# Patient Record
Sex: Male | Born: 1960
Health system: Southern US, Community
[De-identification: ages and names within clinical notes are randomized; demographics above are authoritative.]

## PROBLEM LIST (undated history)

## (undated) DIAGNOSIS — G4730 Sleep apnea, unspecified: Secondary | ICD-10-CM

## (undated) DIAGNOSIS — I10 Essential (primary) hypertension: Secondary | ICD-10-CM

## (undated) DIAGNOSIS — S40212A Abrasion of left shoulder, initial encounter: Secondary | ICD-10-CM

## (undated) DIAGNOSIS — M1990 Unspecified osteoarthritis, unspecified site: Secondary | ICD-10-CM

## (undated) DIAGNOSIS — M25512 Pain in left shoulder: Secondary | ICD-10-CM

## (undated) HISTORY — DX: Essential (primary) hypertension: I10

## (undated) HISTORY — PX: COLONOSCOPY: SHX174

## (undated) HISTORY — PX: HERNIA REPAIR: SHX51

## (undated) HISTORY — PX: NASAL SEPTUM SURGERY: SHX37

---

## 1999-01-04 ENCOUNTER — Emergency Department (HOSPITAL_COMMUNITY): Admission: EM | Admit: 1999-01-04 | Discharge: 1999-01-04 | Payer: Self-pay | Admitting: Emergency Medicine

## 2009-05-12 ENCOUNTER — Emergency Department (HOSPITAL_COMMUNITY): Admission: EM | Admit: 2009-05-12 | Discharge: 2009-05-13 | Payer: Self-pay | Admitting: Emergency Medicine

## 2010-11-28 LAB — CBC
HCT: 41 % (ref 39.0–52.0)
Hemoglobin: 14.2 g/dL (ref 13.0–17.0)
RBC: 4.46 MIL/uL (ref 4.22–5.81)

## 2010-11-28 LAB — ETHANOL: Alcohol, Ethyl (B): 275 mg/dL — ABNORMAL HIGH (ref 0–10)

## 2010-11-28 LAB — PROTIME-INR
INR: 0.9 (ref 0.00–1.49)
Prothrombin Time: 12.4 seconds (ref 11.6–15.2)

## 2011-10-27 ENCOUNTER — Other Ambulatory Visit: Payer: Self-pay | Admitting: Family Medicine

## 2011-11-10 ENCOUNTER — Ambulatory Visit (INDEPENDENT_AMBULATORY_CARE_PROVIDER_SITE_OTHER): Payer: BC Managed Care – PPO | Admitting: Internal Medicine

## 2011-11-10 VITALS — BP 144/94 | HR 76 | Temp 97.6°F | Resp 16 | Ht 69.25 in | Wt 203.0 lb

## 2011-11-10 DIAGNOSIS — I10 Essential (primary) hypertension: Secondary | ICD-10-CM | POA: Insufficient documentation

## 2011-11-10 DIAGNOSIS — Z0000 Encounter for general adult medical examination without abnormal findings: Secondary | ICD-10-CM

## 2011-11-10 LAB — POCT CBC
HCT, POC: 40 % — AB (ref 43.5–53.7)
Lymph, poc: 1.7 (ref 0.6–3.4)
MCH, POC: 30.2 pg (ref 27–31.2)
MCHC: 33.5 g/dL (ref 31.8–35.4)
MCV: 90.2 fL (ref 80–97)
MID (cbc): 0.4 (ref 0–0.9)
POC LYMPH PERCENT: 34 %L (ref 10–50)
Platelet Count, POC: 288 10*3/uL (ref 142–424)
RDW, POC: 12.8 %
WBC: 4.9 10*3/uL (ref 4.6–10.2)

## 2011-11-10 LAB — COMPREHENSIVE METABOLIC PANEL
AST: 31 U/L (ref 0–37)
BUN: 12 mg/dL (ref 6–23)
CO2: 25 mEq/L (ref 19–32)
Calcium: 9.7 mg/dL (ref 8.4–10.5)
Chloride: 103 mEq/L (ref 96–112)
Creat: 0.74 mg/dL (ref 0.50–1.35)
Total Bilirubin: 0.3 mg/dL (ref 0.3–1.2)

## 2011-11-10 LAB — POCT URINALYSIS DIPSTICK
Blood, UA: NEGATIVE
Ketones, UA: NEGATIVE
Protein, UA: NEGATIVE
Spec Grav, UA: 1.02
pH, UA: 6

## 2011-11-10 LAB — POCT UA - MICROSCOPIC ONLY
Crystals, Ur, HPF, POC: NEGATIVE
Mucus, UA: NEGATIVE
Yeast, UA: NEGATIVE

## 2011-11-10 LAB — LIPID PANEL
Cholesterol: 201 mg/dL — ABNORMAL HIGH (ref 0–200)
HDL: 28 mg/dL — ABNORMAL LOW (ref >39)
Total CHOL/HDL Ratio: 7.2 Ratio

## 2011-11-10 MED ORDER — AMLODIPINE BESYLATE 10 MG PO TABS: 10.0000 mg | ORAL_TABLET | Freq: Every day | ORAL | Status: DC

## 2011-11-10 NOTE — Progress Notes (Signed)
  Subjective:    Patient ID: Terry Guerra, male    DOB: [birth year], 51 y.o.   MRN: [id number]  HPI HTN out of med. On amlodipine Feels great. See scanned hx    Review of Systems See scanned ROS   Objective:   Physical Exam  Constitutional: He is oriented to person, place, and time. He appears well-developed and well-nourished. No distress.  HENT:  Head: Normocephalic.  Right Ear: External ear normal.  Left Ear: External ear normal.  Nose: Nose normal.  Mouth/Throat: No oropharyngeal exudate.  Eyes: EOM are normal. Pupils are equal, round, and reactive to light. No scleral icterus.  Neck: Normal range of motion. Neck supple. No thyromegaly present.  Cardiovascular: Normal rate, regular rhythm and normal heart sounds.   Pulmonary/Chest: Effort normal and breath sounds normal.  Abdominal: Soft. Bowel sounds are normal. He exhibits no mass. There is no tenderness.  Genitourinary: Rectum normal, prostate normal and penis normal.  Musculoskeletal: Normal range of motion.  Lymphadenopathy:    He has no cervical adenopathy.  Neurological: He is alert and oriented to person, place, and time. He has normal reflexes. No cranial nerve deficit.  Skin: Skin is warm and dry.  Psychiatric: He has a normal mood and affect.          Assessment & Plan:  HTN restart amlodipine 10mg  Exercise RTC 6 mos

## 2011-11-10 NOTE — Patient Instructions (Signed)

## 2012-11-28 ENCOUNTER — Telehealth: Payer: Self-pay

## 2012-11-28 NOTE — Telephone Encounter (Signed)
Patient would like to know when his last stress test was please call patient at [phone]

## 2012-11-28 NOTE — Telephone Encounter (Signed)
Will pull chart and review.

## 2012-11-29 NOTE — Telephone Encounter (Signed)
Unclear, do not have report, but was referred for stress testing by Dr Cleta Alberts in 2008 to [facility], provided their number [phone]

## 2012-12-08 ENCOUNTER — Other Ambulatory Visit: Payer: Self-pay | Admitting: Internal Medicine

## 2013-01-12 ENCOUNTER — Other Ambulatory Visit: Payer: Self-pay | Admitting: Physician Assistant

## 2013-01-14 ENCOUNTER — Telehealth: Payer: Self-pay

## 2013-01-14 NOTE — Telephone Encounter (Signed)
Patient calling to ask if he can get a refill on:  amLODipine (NORVASC) 10 MG tablet  Refills: 0 ordered Pharmacy: [id number] - [place], [place] - [address]  If possible to get refilled or if needs an office visit please call:   [phone]

## 2013-01-14 NOTE — Telephone Encounter (Signed)
This was refilled on 01/12/13. Needs office visit for further refills.

## 2013-01-15 ENCOUNTER — Other Ambulatory Visit: Payer: Self-pay

## 2013-01-15 MED ORDER — AMLODIPINE BESYLATE 10 MG PO TABS: 10.0000 mg | ORAL_TABLET | Freq: Every day | ORAL | Status: DC

## 2013-01-15 NOTE — Telephone Encounter (Signed)
Called pt, [facility] Rx sent in to [facility] on [address].

## 2013-01-17 ENCOUNTER — Other Ambulatory Visit: Payer: Self-pay | Admitting: *Deleted

## 2013-02-21 ENCOUNTER — Ambulatory Visit (INDEPENDENT_AMBULATORY_CARE_PROVIDER_SITE_OTHER): Payer: BC Managed Care – PPO | Admitting: Internal Medicine

## 2013-02-21 VITALS — BP 132/79 | HR 83 | Temp 98.0°F | Resp 17 | Ht 68.0 in | Wt 200.0 lb

## 2013-02-21 DIAGNOSIS — Z87898 Personal history of other specified conditions: Secondary | ICD-10-CM

## 2013-02-21 DIAGNOSIS — Z0000 Encounter for general adult medical examination without abnormal findings: Secondary | ICD-10-CM

## 2013-02-21 DIAGNOSIS — R5381 Other malaise: Secondary | ICD-10-CM

## 2013-02-21 DIAGNOSIS — Z833 Family history of diabetes mellitus: Secondary | ICD-10-CM

## 2013-02-21 DIAGNOSIS — R5383 Other fatigue: Secondary | ICD-10-CM

## 2013-02-21 DIAGNOSIS — Z7189 Other specified counseling: Secondary | ICD-10-CM

## 2013-02-21 DIAGNOSIS — Z860100 Personal history of colon polyps, unspecified: Secondary | ICD-10-CM

## 2013-02-21 DIAGNOSIS — I10 Essential (primary) hypertension: Secondary | ICD-10-CM

## 2013-02-21 LAB — POCT URINALYSIS DIPSTICK
Bilirubin, UA: NEGATIVE
Glucose, UA: NEGATIVE
Leukocytes, UA: NEGATIVE
Nitrite, UA: NEGATIVE
pH, UA: 6

## 2013-02-21 LAB — POCT UA - MICROSCOPIC ONLY: Yeast, UA: NEGATIVE

## 2013-02-21 LAB — LIPID PANEL
LDL Cholesterol: 98 mg/dL (ref 0–99)
Triglycerides: 331 mg/dL — ABNORMAL HIGH (ref <150)
VLDL: 66 mg/dL — ABNORMAL HIGH (ref 0–40)

## 2013-02-21 LAB — COMPREHENSIVE METABOLIC PANEL
ALT: 54 U/L — ABNORMAL HIGH (ref 0–53)
AST: 36 U/L (ref 0–37)
Calcium: 10.2 mg/dL (ref 8.4–10.5)
Chloride: 103 mEq/L (ref 96–112)
Creat: 0.86 mg/dL (ref 0.50–1.35)
Potassium: 4 mEq/L (ref 3.5–5.3)

## 2013-02-21 LAB — VITAMIN B12: Vitamin B-12: 654 pg/mL (ref 211–911)

## 2013-02-21 LAB — POCT CBC
Granulocyte percent: 64 %G (ref 37–80)
MID (cbc): 0.4 (ref 0–0.9)
MPV: 8 fL (ref 0–99.8)
POC Granulocyte: 4.1 (ref 2–6.9)
POC LYMPH PERCENT: 29.4 %L (ref 10–50)
Platelet Count, POC: 294 10*3/uL (ref 142–424)
RDW, POC: 13.2 %

## 2013-02-21 LAB — IFOBT (OCCULT BLOOD): IFOBT: POSITIVE

## 2013-02-21 MED ORDER — TADALAFIL 20 MG PO TABS: 20.0000 mg | ORAL_TABLET | Freq: Every day | ORAL | Status: DC | PRN

## 2013-02-21 MED ORDER — AMLODIPINE BESYLATE 10 MG PO TABS: 10.0000 mg | ORAL_TABLET | Freq: Every day | ORAL | Status: DC

## 2013-02-21 NOTE — Progress Notes (Signed)
Subjective:    Patient ID: Terry Guerra, male    DOB: 1960/08/30, 52 y.o.   MRN: [id number]  HPI HTN controlled, has a lot of stress. PHx of colon polps, rising psa, and Fhx of DM Quit smokes over 10 yr.  Review of Systems  Constitutional: Positive for fatigue.  HENT: Negative.   Eyes: Negative.   Respiratory: Negative.   Cardiovascular: Negative.   Gastrointestinal: Negative.   Endocrine: Negative.   Genitourinary: Negative.   Musculoskeletal: Negative.   Skin: Negative.   Allergic/Immunologic: Negative.   Neurological: Negative.   Hematological: Negative.   Psychiatric/Behavioral: Negative.        Objective:   Physical Exam  Constitutional: He is oriented to person, place, and time. He appears well-developed and well-nourished.  HENT:  Right Ear: External ear normal.  Left Ear: External ear normal.  Nose: Nose normal.  Mouth/Throat: Oropharynx is clear and moist.  Eyes: Conjunctivae and EOM are normal. Pupils are equal, round, and reactive to light. No scleral icterus.  Neck: Normal range of motion. Neck supple. No tracheal deviation present. No thyromegaly present.  Cardiovascular: Normal rate, regular rhythm, normal heart sounds and intact distal pulses.   No murmur heard. Pulmonary/Chest: Effort normal and breath sounds normal. No respiratory distress. He has no wheezes. He exhibits no tenderness.  Abdominal: Soft. Bowel sounds are normal. He exhibits no mass. There is no tenderness. There is no rebound.  Genitourinary: Rectum normal, prostate normal and penis normal.  Musculoskeletal: Normal range of motion.  Lymphadenopathy:    He has no cervical adenopathy.  Neurological: He is alert and oriented to person, place, and time. He has normal reflexes. No cranial nerve deficit. He exhibits normal muscle tone. Coordination normal.  Skin: Skin is warm. No rash noted.  Psychiatric: He has a normal mood and affect. His behavior is normal. Judgment and thought content  normal.   EKG nl Results for orders placed in visit on 02/21/13  POCT CBC      Result Value Range   WBC 6.4  4.6 - 10.2 K/uL   Lymph, poc 1.9  0.6 - 3.4   POC LYMPH PERCENT 29.4  10 - 50 %L   MID (cbc) 0.4  0 - 0.9   POC MID % 6.6  0 - 12 %M   POC Granulocyte 4.1  2 - 6.9   Granulocyte percent 64.0  37 - 80 %G   RBC 4.93  4.69 - 6.13 M/uL   Hemoglobin 15.3  14.1 - 18.1 g/dL   HCT, POC 78.2  95.6 - 53.7 %   MCV 94.6  80 - 97 fL   MCH, POC 31.0  27 - 31.2 pg   MCHC 32.8  31.8 - 35.4 g/dL   RDW, POC 21.3     Platelet Count, POC 294  142 - 424 K/uL   MPV 8.0  0 - 99.8 fL  IFOBT (OCCULT BLOOD)      Result Value Range   IFOBT Positive    POCT UA - MICROSCOPIC ONLY      Result Value Range   WBC, Ur, HPF, POC 0-1     RBC, urine, microscopic 0-2     Bacteria, U Microscopic trace     Mucus, UA neg     Epithelial cells, urine per micros 1-3     Crystals, Ur, HPF, POC neg     Casts, Ur, LPF, POC neg     Yeast, UA neg    POCT URINALYSIS  DIPSTICK      Result Value Range   Color, UA yellow     Clarity, UA clear     Glucose, UA neg     Bilirubin, UA neg     Ketones, UA neg     Spec Grav, UA 1.025     Blood, UA neg     pH, UA 6.0     Protein, UA neg     Urobilinogen, UA 0.2     Nitrite, UA neg     Leukocytes, UA Negative    GLUCOSE, POCT (MANUAL RESULT ENTRY)      Result Value Range   POC Glucose 76  70 - 99 mg/dl  POCT GLYCOSYLATED HEMOGLOBIN (HGB A1C)      Result Value Range   Hemoglobin A1C 5.0           Assessment & Plan:  Weight Gain HTN--rf meds 35yr Blood in stool/Colon polps--see GI and agrees Call Dr. Leone Payor for f/up colonoscopy

## 2013-02-21 NOTE — Patient Instructions (Addendum)
DASH Diet The DASH diet stands for "Dietary Approaches to Stop Hypertension." It is a healthy eating plan that has been shown to reduce high blood pressure (hypertension) in as little as 14 days, while also possibly providing other significant health benefits. These other health benefits include reducing the risk of breast cancer after menopause and reducing the risk of type 2 diabetes, heart disease, colon cancer, and stroke. Health benefits also include weight loss and slowing kidney failure in patients with chronic kidney disease.  DIET GUIDELINES  Limit salt (sodium). Your diet should contain less than 1500 mg of sodium daily.  Limit refined or processed carbohydrates. Your diet should include mostly whole grains. Desserts and added sugars should be used sparingly.  Include small amounts of heart-healthy fats. These types of fats include nuts, oils, and tub margarine. Limit saturated and trans fats. These fats have been shown to be harmful in the body. CHOOSING FOODS  The following food groups are based on a 2000 calorie diet. See your Registered Dietitian for individual calorie needs. Grains and Grain Products (6 to 8 servings daily)  Eat More Often: Whole-wheat bread, brown rice, whole-grain or wheat pasta, quinoa, popcorn without added fat or salt (air popped).  Eat Less Often: White bread, white pasta, white rice, cornbread. Vegetables (4 to 5 servings daily)  Eat More Often: Fresh, frozen, and canned vegetables. Vegetables may be raw, steamed, roasted, or grilled with a minimal amount of fat.  Eat Less Often/Avoid: Creamed or fried vegetables. Vegetables in a cheese sauce. Fruit (4 to 5 servings daily)  Eat More Often: All fresh, canned (in natural juice), or frozen fruits. Dried fruits without added sugar. One hundred percent fruit juice ( cup [237 mL] daily).  Eat Less Often: Dried fruits with added sugar. Canned fruit in light or heavy syrup. Foot LockerLean Meats, Fish, and Poultry (2  servings or less daily. One serving is 3 to 4 oz [85-114 g]).  Eat More Often: Ninety percent or leaner ground beef, tenderloin, sirloin. Round cuts of beef, chicken breast, [place]key breast. All fish. Grill, bake, or broil your meat. Nothing should be fried.  Eat Less Often/Avoid: Fatty cuts of meat, [place]key, or chicken leg, thigh, or wing. Fried cuts of meat or fish. Dairy (2 to 3 servings)  Eat More Often: Low-fat or fat-free milk, low-fat plain or light yogurt, reduced-fat or part-skim cheese.  Eat Less Often/Avoid: Milk (whole, 2%).Whole milk yogurt. Full-fat cheeses. Nuts, Seeds, and Legumes (4 to 5 servings per week)  Eat More Often: All without added salt.  Eat Less Often/Avoid: Salted nuts and seeds, canned beans with added salt. Fats and Sweets (limited)  Eat More Often: Vegetable oils, tub margarines without trans fats, sugar-free gelatin. Mayonnaise and salad dressings.  Eat Less Often/Avoid: Coconut oils, palm oils, butter, stick margarine, cream, half and half, cookies, candy, pie. FOR MORE INFORMATION The Dash Diet Eating Plan: www.dashdiet.org Document Released: 07/30/2011 Document Revised: 11/02/2011 Document Reviewed: 07/30/2011 [facility] Patient Information 2014 [place]. Insomnia Insomnia is frequent trouble falling and/or staying asleep. Insomnia can be a long term problem or a short term problem. Both are common. Insomnia can be a short term problem when the wakefulness is related to a certain stress or worry. Long term insomnia is often related to ongoing stress during waking hours and/or poor sleeping habits. Overtime, sleep deprivation itself can make the problem worse. Every little thing feels more severe because you are overtired and your ability to cope is decreased. CAUSES   Stress,  anxiety, and depression.  Poor sleeping habits.  Distractions such as TV in the bedroom.  Naps close to bedtime.  Engaging in emotionally charged conversations before  bed.  Technical reading before sleep.  Alcohol and other sedatives. They may make the problem worse. They can hurt normal sleep patterns and normal dream activity.  Stimulants such as caffeine for several hours prior to bedtime.  Pain syndromes and shortness of breath can cause insomnia.  Exercise late at night.  Changing time zones may cause sleeping problems (jet lag). It is sometimes helpful to have someone observe your sleeping patterns. They should look for periods of not breathing during the night (sleep apnea). They should also look to see how long those periods last. If you live alone or observers are uncertain, you can also be observed at a sleep clinic where your sleep patterns will be professionally monitored. Sleep apnea requires a checkup and treatment. Give your caregivers your medical history. Give your caregivers observations your family has made about your sleep.  SYMPTOMS   Not feeling rested in the morning.  Anxiety and restlessness at bedtime.  Difficulty falling and staying asleep. TREATMENT   Your caregiver may prescribe treatment for an underlying medical disorders. Your caregiver can give advice or help if you are using alcohol or other drugs for self-medication. Treatment of underlying problems will usually eliminate insomnia problems.  Medications can be prescribed for short time use. They are generally not recommended for lengthy use.  Over-the-counter sleep medicines are not recommended for lengthy use. They can be habit forming.  You can promote easier sleeping by making lifestyle changes such as:  Using relaxation techniques that help with breathing and reduce muscle tension.  Exercising earlier in the day.  Changing your diet and the time of your last meal. No night time snacks.  Establish a regular time to go to bed.  Counseling can help with stressful problems and worry.  Soothing music and white noise may be helpful if there are background  noises you cannot remove.  Stop tedious detailed work at least one hour before bedtime. HOME CARE INSTRUCTIONS   Keep a diary. Inform your caregiver about your progress. This includes any medication side effects. See your caregiver regularly. Take note of:  Times when you are asleep.  Times when you are awake during the night.  The quality of your sleep.  How you feel the next day. This information will help your caregiver care for you.  Get out of bed if you are still awake after 15 minutes. Read or do some quiet activity. Keep the lights down. Wait until you feel sleepy and go back to bed.  Keep regular sleeping and waking hours. Avoid naps.  Exercise regularly.  Avoid distractions at bedtime. Distractions include watching television or engaging in any intense or detailed activity like attempting to balance the household checkbook.  Develop a bedtime ritual. Keep a familiar routine of bathing, brushing your teeth, climbing into bed at the same time each night, listening to soothing music. Routines increase the success of falling to sleep faster.  Use relaxation techniques. This can be using breathing and muscle tension release routines. It can also include visualizing peaceful scenes. You can also help control troubling or intruding thoughts by keeping your mind occupied with boring or repetitive thoughts like the old concept of counting sheep. You can make it more creative like imagining planting one beautiful flower after another in your backyard garden.  During your day, work to  eliminate stress. When this is not possible use some of the previous suggestions to help reduce the anxiety that accompanies stressful situations. MAKE SURE YOU:   Understand these instructions.  Will watch your condition.  Will get help right away if you are not doing well or get worse. Document Released: 08/07/2000 Document Revised: 11/02/2011 Document Reviewed: 09/07/2007 [facility] Patient  Information 2014 [place], [place]. Stress Stress-related medical problems are becoming increasingly common. The body has a built-in physical response to stressful situations. Faced with pressure, challenge or danger, we need to react quickly. Our bodies release hormones such as cortisol and adrenaline to help do this. These hormones are part of the "fight or flight" response and affect the metabolic rate, heart rate and blood pressure, resulting in a heightened, stressed state that prepares the body for optimum performance in dealing with a stressful situation. It is likely that early man required these mechanisms to stay alive, but usually modern stresses do not call for this, and the same hormones released in today's world can damage health and reduce coping ability. CAUSES  Pressure to perform at work, at school or in sports.  Threats of physical violence.  Money worries.  Arguments.  Family conflicts.  Divorce or separation from significant other.  Bereavement.  New job or unemployment.  Changes in location.  Alcohol or drug abuse. SOMETIMES, THERE IS NO PARTICULAR REASON FOR DEVELOPING STRESS. Almost all people are at risk of being stressed at some time in their lives. It is important to know that some stress is temporary and some is long term.  Temporary stress will go away when a situation is resolved. Most people can cope with short periods of stress, and it can often be relieved by relaxing, taking a walk, chatting through issues with friends, or having a good night's sleep.  Chronic (long-term, continuous) stress is much harder to deal with. It can be psychologically and emotionally damaging. It can be harmful both for an individual and for friends and family. SYMPTOMS Everyone reacts to stress differently. There are some common effects that help [place] recognize it. In times of extreme stress, people may:  Shake uncontrollably.  Breathe faster and deeper than normal  (hyperventilate).  Vomit.  For people with asthma, stress can trigger an attack.  For some people, stress may trigger migraine headaches, ulcers, and body pain. PHYSICAL EFFECTS OF STRESS MAY INCLUDE:  Loss of energy.  Skin problems.  Aches and pains resulting from tense muscles, including neck ache, backache and tension headaches.  Increased pain from arthritis and other conditions.  Irregular heart beat (palpitations).  Periods of irritability or anger.  Apathy or depression.  Anxiety (feeling uptight or worrying).  Unusual behavior.  Loss of appetite.  Comfort eating.  Lack of concentration.  Loss of, or decreased, sex-drive.  Increased smoking, drinking, or recreational drug use.  For women, missed periods.  Ulcers, joint pain, and muscle pain. Post-traumatic stress is the stress caused by any serious accident, strong emotional damage, or extremely difficult or violent experience such as rape or war. Post-traumatic stress victims can experience mixtures of emotions such as fear, shame, depression, guilt or anger. It may include recurrent memories or images that may be haunting. These feelings can last for weeks, months or even years after the traumatic event that triggered them. Specialized treatment, possibly with medicines and psychological therapies, is available. If stress is causing physical symptoms, severe distress or making it difficult for you to function as normal, it is worth seeing  your caregiver. It is important to remember that although stress is a usual part of life, extreme or prolonged stress can lead to other illnesses that will need treatment. It is better to visit a doctor sooner rather than later. Stress has been linked to the development of high blood pressure and heart disease, as well as insomnia and depression. There is no diagnostic test for stress since everyone reacts to it differently. But a caregiver will be able to spot the physical  symptoms, such as:  Headaches.  Shingles.  Ulcers. Emotional distress such as intense worry, low mood or irritability should be detected when the doctor asks pertinent questions to identify any underlying problems that might be the cause. In case there are physical reasons for the symptoms, the doctor may also want to do some tests to exclude certain conditions. If you feel that you are suffering from stress, try to identify the aspects of your life that are causing it. Sometimes you may not be able to change or avoid them, but even a small change can have a positive ripple effect. A simple lifestyle change can make all the difference. STRATEGIES THAT CAN HELP DEAL WITH STRESS:  Delegating or sharing responsibilities.  Avoiding confrontations.  Learning to be more assertive.  Regular exercise.  Avoid using alcohol or street drugs to cope.  Eating a healthy, balanced diet, rich in fruit and vegetables and proteins.  Finding humor or absurdity in stressful situations.  Never taking on more than you know you can handle comfortably.  Organizing your time better to get as much done as possible.  Talking to friends or family and sharing your thoughts and fears.  Listening to music or relaxation tapes.  Tensing and then relaxing your muscles, starting at the toes and working up to the head and neck. If you think that you would benefit from help, either in identifying the things that are causing your stress or in learning techniques to help you relax, see a caregiver who is capable of helping you with this. Rather than relying on medications, it is usually better to try and identify the things in your life that are causing stress and try to deal with them. There are many techniques of managing stress including counseling, psychotherapy, aromatherapy, yoga, and exercise. Your caregiver can help you determine what is best for you. Document Released: 10/31/2002 Document Revised: 11/02/2011  Document Reviewed: 09/27/2007 [facility] Patient Information 2014 [place], [place]. Colorectal Cancer Colorectal cancer is an abnormal growth of tissue (tumor) in the colon or rectum that is cancerous (malignant). Unlike noncancerous (benign) tumors, malignant tumors can spread to other parts of your body. The colon is the large bowel or large intestine. The rectum is the last several inches of the colon. CAUSES  The exact cause of colon cancer is unknown.  RISK FACTORS The majority of patients do not have identifiable risk factors, but the following factors may increase your chances of getting colon cancer:  Age. Most colorectal cancers occur in people older than 50 years.  Having abnormal growths (polyps) on the inner wall of the colon or rectum.  Diabetes.  Being African American.  Family history of hereditary nonpolyposis colon cancer. This condition is caused by changes in the genes that are responsible for repairing mismatched DNA.  Family history of familial adenomatous polyposis (FAP). This is a rare, inherited condition in which hundreds of polyps form in the colon and rectum. It is caused by a change in the APC gene. Unless FAP  is treated, it usually leads to colorectal cancer by age 55.  Personal history of cancer. A person who has already had colorectal cancer may develop it a second time. Also, women with a history of ovarian, uterine, or breast cancer are at a somewhat higher risk of developing colorectal cancer.  Inflammatory bowel disease, including ulcerative colitis and Crohn's disease.  Being obese or eating a diet that is high in fat (especially animal fat) and low in fiber, fruits, and vegetables.  Smoking. A person who smokes cigarettes may be at increased risk of developing polyps and colorectal cancer.  Heavy alcohol use. SYMPTOMS Early colorectal cancer often does not cause symptoms. As the cancer grows, symptoms may  include:  Diarrhea.  Constipation.  Feeling like the bowel does not empty completely after a bowel movement.  Blood in the stool.  Stools that are narrower than usual.  Abdominal discomfort, pain, bloating, fullness, or cramps.  Unexplained weight loss.  Constant tiredness.  Nausea and vomiting. DIAGNOSIS  Your caregiver will ask about your medical history. He or she may also perform a number of procedures, such as:  A physical exam.  Blood tests. This may include a routine complete blood count and iron level testing. Your caregiver may also check for carcinoembryonic antigen (CEA) and other substances in the blood. Some people who have colorectal cancer have a high CEA level. These levels may be used to follow the activity of your colon cancer.  Chest X-rays, computed tomography (CT) scans, or magnetic resonance imaging (MRI).  Taking a tissue sample (biopsy) from the colon or rectum. The sample is examined under a microscope to look for cancer cells.  Sigmoidoscopy. With this test, your caregiver can see inside your colon. A thin flexible tube (sigmoidoscope) is placed into your rectum. This device has a light source and a tiny video camera in it. Your caregiver uses the sigmoidoscope to look at the last third of your colon.  Colonoscopy. This test is like sigmoidoscopy, but your caregiver looks at the entire colon. This test usually requires medicine that helps you relax (sedative).  Endorectal ultrasound. With this test, your caregiver can see how deep a rectal tumor has grown and whether the cancer has spread to lymph nodes or other nearby tissues. A tool (probe) is inserted into the rectum. The probe sends out sound waves to the rectum and nearby tissues, and a computer uses the echoes to create a picture. Your cancer will be staged to determine its severity and extent. Staging is a careful attempt to find out the size of the tumor, whether the cancer has spread, and if so,  to what parts of the body. You may need to have more tests to determine the stage of your cancer. The test results will help determine what treatment plan is best for you. STAGES  Stage 0. The cancer is found only in the innermost lining of the colon or rectum.  Stage I. The cancer has grown into the inner wall of the colon or rectum. The cancer has not yet reached the outer wall of the colon.  Stage II. The cancer extends more deeply into or through the wall of the colon or rectum. It may have invaded nearby tissue, but cancer cells have not spread to the lymph nodes.  Stage III. The cancer has spread to nearby lymph nodes but not to other parts of the body.  Stage IV. The cancer has spread to other parts of the body, such as the  liver or lungs. Your caregiver may tell you the detailed stage of your cancer. In that case, the stage will include both a number and a letter. TREATMENT  Depending on the type and stage, colorectal cancer may be treated with surgery, radiation therapy, or chemotherapy. Some patients have a combination of these therapies.  Surgery may be done to remove the polyps from your colon. In early stages, your caregiver may be able to do this during a colonoscopy. In later stages, surgery may be done to remove part of your colon.  Radiation therapy uses high-energy rays to kill cancer cells. This is usually recommended for patients with rectal cancer.  Chemotherapy is the use of drugs to kill cancer cells. Caregivers also give chemotherapy to help reduce pain and other problems caused by colorectal cancer. This may be done even if the cancer is not curable. HOME CARE INSTRUCTIONS   Only take over-the-counter or prescription medicines for pain, discomfort, or fever as directed by your caregiver.  Maintain a healthy diet.  Consider joining a support group. This may help you learn to cope with the stress of having colorectal cancer.  Seek advice to help you manage  treatment side effects.  Keep all follow-up appointments as directed by your caregiver.  Inform your cancer specialist if you are admitted to the hospital. SEEK IMMEDIATE MEDICAL CARE IF:   Your diarrhea or constipation does not go away.  You have alternating constipation and diarrhea.  You have blood in your stools.  Your abdominal pain gets worse.  You lose weight without trying.  You notice new fatigue or weakness.  You develop a fever during chemotherapy treatment. Document Released: 08/10/2005 Document Revised: 11/02/2011 Document Reviewed: 07/28/2011 [facility] Patient Information 2014 [place], [place].

## 2013-09-13 ENCOUNTER — Ambulatory Visit (INDEPENDENT_AMBULATORY_CARE_PROVIDER_SITE_OTHER): Payer: BC Managed Care – PPO | Admitting: Emergency Medicine

## 2013-09-13 ENCOUNTER — Ambulatory Visit: Payer: BC Managed Care – PPO

## 2013-09-13 VITALS — BP 142/94 | HR 98 | Temp 98.0°F | Resp 17 | Ht 68.0 in | Wt 206.0 lb

## 2013-09-13 DIAGNOSIS — M2390 Unspecified internal derangement of unspecified knee: Secondary | ICD-10-CM

## 2013-09-13 MED ORDER — NAPROXEN SODIUM 550 MG PO TABS: 550.0000 mg | ORAL_TABLET | Freq: Two times a day (BID) | ORAL | Status: AC

## 2013-09-13 NOTE — Progress Notes (Addendum)
[facility] [address], [place] [id number] [phone]  Date:  09/13/2013   Name:  Terry JollyJoseph S Guerra   DOB:  10/05/1960   MRN:  [id number]  PCP:  No primary provider on file.    Chief Complaint: Knee Pain   History of Present Illness:  Terry Guerra is a 53 y.o. very pleasant male patient who presents with the following:  Left knee swelling and pain.  Limited ROM, pain with stairs.  No history of injury.  Pain and swelling for at lease two months.  No relief with aleve.  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  Denies other complaint or health concern today.   Patient Active Problem List   Diagnosis Date Noted  . HTN (hypertension) 11/10/2011    Past Medical History  Diagnosis Date  . Hypertension     Past Surgical History  Procedure Laterality Date  . Hernia repair      History  Substance Use Topics  . Smoking status: Former Games developermoker  . Smokeless tobacco: Not on file  . Alcohol Use: No    Family History  Problem Relation Age of Onset  . Cancer Mother   . Diabetes Father   . Allergies Brother     No Known Allergies  Medication list has been reviewed and updated.  Current Outpatient Prescriptions on File Prior to Visit  Medication Sig Dispense Refill  . amLODipine (NORVASC) 10 MG tablet Take 1 tablet (10 mg total) by mouth daily. PATIENT NEEDS OFFICE VISIT FOR ADDITIONAL REFILLS  90 tablet  3  . tadalafil (CIALIS) 20 MG tablet Take 1 tablet (20 mg total) by mouth daily as needed for erectile dysfunction.  10 tablet  3   No current facility-administered medications on file prior to visit.    Review of Systems:  As per HPI, otherwise negative.    Physical Examination: Filed Vitals:   09/13/13 1451  BP: 142/94  Pulse: 98  Temp: 98 F (36.7 C)  Resp: 17   Filed Vitals:   09/13/13 1451  Height: 5\' 8"  (1.727 m)  Weight: 206 lb (93.441 kg)   Body mass index is 31.33 kg/(m^2). Ideal Body Weight:  Weight in (lb) to have BMI = 25: 164.1   GEN: WDWN, NAD, Non-toxic, Alert & Oriented x 3 HEENT: Atraumatic, Normocephalic.  Ears and Nose: No external deformity. EXTR: No clubbing/cyanosis/edema NEURO: Normal gait.  PSYCH: Normally interactive. Conversant. Not depressed or anxious appearing.  Calm demeanor.  LEFT Knee:  Limited ROM in that he lacks about 10 degrees of full flexion.  Joint stable.  Some effusion   Assessment and Plan: Internal derangement knee Left Anaprox MRI  Signed,  Phillips OdorJeffery Anderson, MD   [facility] reading (PRIMARY) by  Dr. Dareen PianoAnderson.  Negative knee.

## 2013-09-13 NOTE — Patient Instructions (Signed)
Knee Sprain  A knee sprain is a tear in one of the strong, fibrous tissues that connect the bones (ligaments) in your knee. The severity of the sprain depends on how much of the ligament is torn. The tear can be either partial or complete.  CAUSES   Often, sprains are a result of a fall or injury. The force of the impact causes the fibers of your ligament to stretch too much. This excess tension causes the fibers of your ligament to tear.  SIGNS AND SYMPTOMS   You may have some loss of motion in your knee. Other symptoms include:   Bruising.   Pain in the knee area.   Tenderness of the knee to the touch.   Swelling.  DIAGNOSIS   To diagnose a knee sprain, your health care provider will physically examine your knee. Your health care provider may also suggest an X-ray exam of your knee to make sure no bones are broken.  TREATMENT   If your ligament is only partially torn, treatment usually involves keeping the knee in a fixed position (immobilization) or bracing your knee for activities that require movement for several weeks. To do this, your health care provider will apply a bandage, cast, or splint to keep your knee from moving and to support your knee during movement until it heals. For a partially torn ligament, the healing process usually takes 4 6 weeks.  If your ligament is completely torn, depending on which ligament it is, you may need surgery to reconnect the ligament to the bone or reconstruct it. After surgery, a cast or splint may be applied and will need to stay on your knee for 4 6 weeks while your ligament heals.  HOME CARE INSTRUCTIONS   Keep your injured knee elevated to decrease swelling.   To ease pain and swelling, apply ice to the injured area:   Put ice in a plastic bag.   Place a towel between your skin and the bag.   Leave the ice on for 20 minutes, 2 3 times a day.   Only take medicine for pain as directed by your health care provider.   Do not leave your knee unprotected until  pain and stiffness go away (usually 4 6 weeks).   If you have a cast or splint, do not allow it to get wet. If you have been instructed not to remove it, cover it with a plastic bag when you shower or bathe. Do not swim.   Your health care provider may suggest exercises for you to do during your recovery to prevent or limit permanent weakness and stiffness.  SEEK IMMEDIATE MEDICAL CARE IF:   Your cast or splint becomes damaged.   Your pain becomes worse.   You have significant pain, swelling, or numbness below the cast or splint.  MAKE SURE YOU:   Understand these instructions.   Will watch your condition.   Will get help right away if you are not doing well or get worse.  Document Released: 08/10/2005 Document Revised: 05/31/2013 Document Reviewed: 03/22/2013  [employer] Patient Information 2014 [employer]

## 2014-01-18 ENCOUNTER — Other Ambulatory Visit: Payer: Self-pay | Admitting: Emergency Medicine

## 2014-04-22 ENCOUNTER — Other Ambulatory Visit: Payer: Self-pay | Admitting: Internal Medicine

## 2014-06-01 ENCOUNTER — Other Ambulatory Visit: Payer: Self-pay | Admitting: Internal Medicine

## 2014-06-02 ENCOUNTER — Telehealth: Payer: Self-pay

## 2014-06-02 MED ORDER — AMLODIPINE BESYLATE 10 MG PO TABS: 10.0000 mg | ORAL_TABLET | Freq: Every day | ORAL | Status: DC

## 2014-06-02 NOTE — Telephone Encounter (Signed)
PATIENT STATES HIS REFILL FOR AMLODIPINE 10 MG WAS DENIED BECAUSE HE NEEDS TO COME INTO THE OFFICE FOR A PHYSICAL. HE IS WORKING OUT OF TOWN AND DOES NOT HAVE TIME TO COME IN RIGHT NOW. IN THE MEANTIME, HE IS ALMOST COMPLETELY OUT. HE WOULD LIKE TO GET 1 MORE REFILL UNTIL HE CAN GET BACK IN. BEST PHONE [phone] (CELL)  PHARMACY CHOICE IS [facility]  ON [address].  MBC

## 2014-06-02 NOTE — Telephone Encounter (Signed)
Sent in 2 week supply- lm to advise pt.

## 2014-06-24 ENCOUNTER — Telehealth: Payer: Self-pay

## 2014-06-24 NOTE — Telephone Encounter (Signed)
Pt called in wanting to know the status of his refill for his BP medicine, stated he was going out of town tomorrow and needed it filled before then, I told the patient it normally takes 24-48 hours for someone to call him back to let him know about it. He seemed a little upset by that but said that was fine.  His call back number is [phone]

## 2014-06-24 NOTE — Telephone Encounter (Signed)
See other phone message  

## 2014-06-24 NOTE — Telephone Encounter (Signed)
Patient requesting a refill on his BP medication. Per patient he is unable to come in at this time because he is working out of town. [facility] [place], patients call back number is [phone]

## 2014-06-25 MED ORDER — AMLODIPINE BESYLATE 10 MG PO TABS: 10.0000 mg | ORAL_TABLET | Freq: Every day | ORAL | Status: DC

## 2014-06-25 NOTE — Telephone Encounter (Signed)
Pt advised he must come in for an OV. Advised this was the last refill until he comes into the clinic or schedules an appt.

## 2014-07-13 ENCOUNTER — Ambulatory Visit (INDEPENDENT_AMBULATORY_CARE_PROVIDER_SITE_OTHER): Payer: BC Managed Care – PPO | Admitting: Internal Medicine

## 2014-07-13 VITALS — BP 140/88 | HR 80 | Temp 98.1°F | Resp 18 | Ht 68.25 in | Wt 204.2 lb

## 2014-07-13 DIAGNOSIS — Z125 Encounter for screening for malignant neoplasm of prostate: Secondary | ICD-10-CM

## 2014-07-13 DIAGNOSIS — M25562 Pain in left knee: Secondary | ICD-10-CM

## 2014-07-13 DIAGNOSIS — I10 Essential (primary) hypertension: Secondary | ICD-10-CM

## 2014-07-13 DIAGNOSIS — Z0000 Encounter for general adult medical examination without abnormal findings: Secondary | ICD-10-CM

## 2014-07-13 DIAGNOSIS — Z23 Encounter for immunization: Secondary | ICD-10-CM

## 2014-07-13 DIAGNOSIS — Z79899 Other long term (current) drug therapy: Secondary | ICD-10-CM

## 2014-07-13 DIAGNOSIS — H9190 Unspecified hearing loss, unspecified ear: Secondary | ICD-10-CM

## 2014-07-13 LAB — POCT CBC
GRANULOCYTE PERCENT: 59.9 % (ref 37–80)
HEMATOCRIT: 46.6 % (ref 43.5–53.7)
HEMOGLOBIN: 15.6 g/dL (ref 14.1–18.1)
Lymph, poc: 2.1 (ref 0.6–3.4)
MCH, POC: 30.5 pg (ref 27–31.2)
MCHC: 33.4 g/dL (ref 31.8–35.4)
MCV: 91.4 fL (ref 80–97)
MID (cbc): 0.4 (ref 0–0.9)
MPV: 6.9 fL (ref 0–99.8)
POC GRANULOCYTE: 3.8 (ref 2–6.9)
POC LYMPH PERCENT: 33.4 %L (ref 10–50)
POC MID %: 6.7 %M (ref 0–12)
Platelet Count, POC: 303 10*3/uL (ref 142–424)
RBC: 5.1 M/uL (ref 4.69–6.13)
RDW, POC: 12.5 %
WBC: 6.3 10*3/uL (ref 4.6–10.2)

## 2014-07-13 LAB — COMPLETE METABOLIC PANEL WITH GFR
ALBUMIN: 4.5 g/dL (ref 3.5–5.2)
ALK PHOS: 65 U/L (ref 39–117)
ALT: 45 U/L (ref 0–53)
AST: 23 U/L (ref 0–37)
BILIRUBIN TOTAL: 0.4 mg/dL (ref 0.2–1.2)
BUN: 12 mg/dL (ref 6–23)
CO2: 25 mEq/L (ref 19–32)
Calcium: 10 mg/dL (ref 8.4–10.5)
Chloride: 103 mEq/L (ref 96–112)
Creat: 0.9 mg/dL (ref 0.50–1.35)
Glucose, Bld: 97 mg/dL (ref 70–99)
POTASSIUM: 4.2 meq/L (ref 3.5–5.3)
Sodium: 138 mEq/L (ref 135–145)
TOTAL PROTEIN: 7.6 g/dL (ref 6.0–8.3)

## 2014-07-13 LAB — POCT UA - MICROSCOPIC ONLY
BACTERIA, U MICROSCOPIC: NEGATIVE
CASTS, UR, LPF, POC: NEGATIVE
Crystals, Ur, HPF, POC: NEGATIVE
MUCUS UA: POSITIVE
Yeast, UA: NEGATIVE

## 2014-07-13 LAB — POCT URINALYSIS DIPSTICK
Bilirubin, UA: NEGATIVE
Glucose, UA: NEGATIVE
KETONES UA: NEGATIVE
Leukocytes, UA: NEGATIVE
Nitrite, UA: NEGATIVE
PROTEIN UA: NEGATIVE
RBC UA: NEGATIVE
Spec Grav, UA: 1.02
UROBILINOGEN UA: 0.2
pH, UA: 6

## 2014-07-13 LAB — TSH: TSH: 2.852 u[IU]/mL (ref 0.350–4.500)

## 2014-07-13 LAB — LIPID PANEL
Cholesterol: 206 mg/dL — ABNORMAL HIGH (ref 0–200)
HDL: 30 mg/dL — ABNORMAL LOW (ref >39)
LDL CALC: 107 mg/dL — AB (ref 0–99)
TRIGLYCERIDES: 343 mg/dL — AB (ref <150)
Total CHOL/HDL Ratio: 6.9 Ratio
VLDL: 69 mg/dL — AB (ref 0–40)

## 2014-07-13 MED ORDER — AMLODIPINE BESYLATE 10 MG PO TABS: 10.0000 mg | ORAL_TABLET | Freq: Every day | ORAL | Status: DC

## 2014-07-13 NOTE — Patient Instructions (Addendum)
DASH Eating Plan DASH stands for "Dietary Approaches to Stop Hypertension." The DASH eating plan is a healthy eating plan that has been shown to reduce high blood pressure (hypertension). Additional health benefits may include reducing the risk of type 2 diabetes mellitus, heart disease, and stroke. The DASH eating plan may also help with weight loss. WHAT DO I NEED TO KNOW ABOUT THE DASH EATING PLAN? For the DASH eating plan, you will follow these general guidelines:  Choose foods with a percent daily value for sodium of less than 5% (as listed on the food label).  Use salt-free seasonings or herbs instead of table salt or sea salt.  Check with your health care provider or pharmacist before using salt substitutes.  Eat lower-sodium products, often labeled as "lower sodium" or "no salt added."  Eat fresh foods.  Eat more vegetables, fruits, and low-fat dairy products.  Choose whole grains. Look for the word "whole" as the first word in the ingredient list.  Choose fish and skinless chicken or turkey more often than red meat. Limit fish, poultry, and meat to 6 oz (170 g) each day.  Limit sweets, desserts, sugars, and sugary drinks.  Choose heart-healthy fats.  Limit cheese to 1 oz (28 g) per day.  Eat more home-cooked food and less restaurant, buffet, and fast food.  Limit fried foods.  Cook foods using methods other than frying.  Limit canned vegetables. If you do use them, rinse them well to decrease the sodium.  When eating at a restaurant, ask that your food be prepared with less salt, or no salt if possible. WHAT FOODS CAN I EAT? Seek help from a dietitian for individual calorie needs. Grains Whole grain or whole wheat bread. Brown rice. Whole grain or whole wheat pasta. Quinoa, bulgur, and whole grain cereals. Low-sodium cereals. Corn or whole wheat flour tortillas. Whole grain cornbread. Whole grain crackers. Low-sodium crackers. Vegetables Fresh or frozen vegetables  (raw, steamed, roasted, or grilled). Low-sodium or reduced-sodium tomato and vegetable juices. Low-sodium or reduced-sodium tomato sauce and paste. Low-sodium or reduced-sodium canned vegetables.  Fruits All fresh, canned (in natural juice), or frozen fruits. Meat and Other Protein Products Ground beef (85% or leaner), grass-fed beef, or beef trimmed of fat. Skinless chicken or turkey. Ground chicken or turkey. Pork trimmed of fat. All fish and seafood. Eggs. Dried beans, peas, or lentils. Unsalted nuts and seeds. Unsalted canned beans. Dairy Low-fat dairy products, such as skim or 1% milk, 2% or reduced-fat cheeses, low-fat ricotta or cottage cheese, or plain low-fat yogurt. Low-sodium or reduced-sodium cheeses. Fats and Oils Tub margarines without trans fats. Light or reduced-fat mayonnaise and salad dressings (reduced sodium). Avocado. Safflower, olive, or canola oils. Natural peanut or almond butter. Other Unsalted popcorn and pretzels. The items listed above may not be a complete list of recommended foods or beverages. Contact your dietitian for more options. WHAT FOODS ARE NOT RECOMMENDED? Grains White bread. White pasta. White rice. Refined cornbread. Bagels and croissants. Crackers that contain trans fat. Vegetables Creamed or fried vegetables. Vegetables in a cheese sauce. Regular canned vegetables. Regular canned tomato sauce and paste. Regular tomato and vegetable juices. Fruits Dried fruits. Canned fruit in light or heavy syrup. Fruit juice. Meat and Other Protein Products Fatty cuts of meat. Ribs, chicken wings, bacon, sausage, bologna, salami, chitterlings, fatback, hot dogs, bratwurst, and packaged luncheon meats. Salted nuts and seeds. Canned beans with salt. Dairy Whole or 2% milk, cream, half-and-half, and cream cheese. Whole-fat or sweetened yogurt. Full-fat   cheeses or blue cheese. Nondairy creamers and whipped toppings. Processed cheese, cheese spreads, or cheese  curds. Condiments Onion and garlic salt, seasoned salt, table salt, and sea salt. Canned and packaged gravies. Worcestershire sauce. Tartar sauce. Barbecue sauce. Teriyaki sauce. Soy sauce, including reduced sodium. Steak sauce. Fish sauce. Oyster sauce. Cocktail sauce. Horseradish. Ketchup and mustard. Meat flavorings and tenderizers. Bouillon cubes. Hot sauce. Tabasco sauce. Marinades. Taco seasonings. Relishes. Fats and Oils Butter, stick margarine, lard, shortening, ghee, and bacon fat. Coconut, palm kernel, or palm oils. Regular salad dressings. Other Pickles and olives. Salted popcorn and pretzels. The items listed above may not be a complete list of foods and beverages to avoid. Contact your dietitian for more information. WHERE CAN I FIND MORE INFORMATION? National Heart, Lung, and Blood Institute: CablePromo.it Document Released: 07/30/2011 Document Revised: 12/25/2013 Document Reviewed: 06/14/2013 [facility] Patient Information 2015 [place], [place]. This information is not intended to replace advice given to you by your health care provider. Make sure you discuss any questions you have with your health care provider.  Influenza Vaccine (Flu Vaccine, Inactivated or Recombinant) 2014-2015: What You Need to Know 1. Why get vaccinated? Influenza ("flu") is a contagious disease that spreads around the [place] every winter, usually between October and May. Flu is caused by influenza viruses, and is spread mainly by coughing, sneezing, and close contact. Anyone can get flu, but the risk of getting flu is highest among children. Symptoms come on suddenly and may last several days. They can include:  fever/chills  sore throat  muscle aches  fatigue  cough  headache  runny or stuffy nose Flu can make some people much sicker than others. These people include young children, people 29 and older, pregnant women, and people with certain health  conditions-such as heart, lung or kidney disease, nervous system disorders, or a weakened immune system. Flu vaccination is especially important for these people, and anyone in close contact with them. Flu can also lead to pneumonia, and make existing medical conditions worse. It can cause diarrhea and seizures in children. Each year thousands of people in the [place] die from flu, and many more are hospitalized. Flu vaccine is the best protection against flu and its complications. Flu vaccine also helps prevent spreading flu from person to person. 2. Inactivated and recombinant flu vaccines You are getting an injectable flu vaccine, which is either an "inactivated" or "recombinant" vaccine. These vaccines do not contain any live influenza virus. They are given by injection with a needle, and often called the "flu shot."  A different live, attenuated (weakened) influenza vaccine is sprayed into the nostrils. This vaccine is described in a separate Vaccine Information Statement. Flu vaccination is recommended every year. Some children 6 months through 76 years of age might need two doses during one year. Flu viruses are always changing. Each year's flu vaccine is made to protect against 3 or 4 viruses that are likely to cause disease that year. Flu vaccine cannot prevent all cases of flu, but it is the best defense against the disease.  It takes about 2 weeks for protection to develop after the vaccination, and protection lasts several months to a year. Some illnesses that are not caused by influenza virus are often mistaken for flu. Flu vaccine will not prevent these illnesses. It can only prevent influenza. Some inactivated flu vaccine contains a very small amount of a mercury-based preservative called thimerosal. Studies have shown that thimerosal in vaccines is not harmful, but flu vaccines  that do not contain a preservative are available. 3. Some people should not get this vaccine Tell the  person who gives you the vaccine:  If you have any severe, life-threatening allergies. If you ever had a life-threatening allergic reaction after a dose of flu vaccine, or have a severe allergy to any part of this vaccine, including (for example) an allergy to gelatin, antibiotics, or eggs, you may be advised not to get vaccinated. Most, but not all, types of flu vaccine contain a small amount of egg protein.  If you ever had Guillain-Barr Syndrome (a severe paralyzing illness, also called GBS). Some people with a history of GBS should not get this vaccine. This should be discussed with your doctor.  If you are not feeling well. It is usually okay to get flu vaccine when you have a mild illness, but you might be advised to wait until you feel better. You should come back when you are better. 4. Risks of a vaccine reaction With a vaccine, like any medicine, there is a chance of side effects. These are usually mild and go away on their own. Problems that could happen after any vaccine:  Brief fainting spells can happen after any medical procedure, including vaccination. Sitting or lying down for about 15 minutes can help prevent fainting, and injuries caused by a fall. Tell your doctor if you feel dizzy, or have vision changes or ringing in the ears.  Severe shoulder pain and reduced range of motion in the arm where a shot was given can happen, very rarely, after a vaccination.  Severe allergic reactions from a vaccine are very rare, estimated at less than 1 in a million doses. If one were to occur, it would usually be within a few minutes to a few hours after the vaccination. Mild problems following inactivated flu vaccine:  soreness, redness, or swelling where the shot was given  hoarseness  sore, red or itchy eyes  cough  fever  aches  headache  itching  fatigue If these problems occur, they usually begin soon after the shot and last 1 or 2 days. Moderate problems following  inactivated flu vaccine:  Young children who get inactivated flu vaccine and pneumococcal vaccine (PCV13) at the same time may be at increased risk for seizures caused by fever. Ask your doctor for more information. Tell your doctor if a child who is getting flu vaccine has ever had a seizure. Inactivated flu vaccine does not contain live flu virus, so you cannot get the flu from this vaccine. As with any medicine, there is a very remote chance of a vaccine causing a serious injury or death. The safety of vaccines is always being monitored. For more information, visit: http://floyd.org/www.cdc.gov/vaccinesafety/ 5. What if there is a serious reaction? What should I look for?  Look for anything that concerns you, such as signs of a severe allergic reaction, very high fever, or behavior changes. Signs of a severe allergic reaction can include hives, swelling of the face and throat, difficulty breathing, a fast heartbeat, dizziness, and weakness. These would start a few minutes to a few hours after the vaccination. What should I do?  If you think it is a severe allergic reaction or other emergency that can't wait, call 9-1-1 and get the person to the nearest hospital. Otherwise, call your doctor.  Afterward, the reaction should be reported to the Vaccine Adverse Event Reporting System (VAERS). Your doctor should file this report, or you can do it yourself through the  VAERS web site at www.vaers.LAgents.nohhs.gov, or by calling 1-718-352-6153. VAERS does not give medical advice. 6. The National Vaccine Injury Compensation Program The [employer] Vaccine Injury Compensation Program (VICP) is a federal program that was created to compensate people who may have been injured by certain vaccines. Persons who believe they may have been injured by a vaccine can learn about the program and about filing a claim by calling 1-440-103-0310 or visiting the VICP website at SpiritualWord.atwww.hrsa.gov/vaccinecompensation. There is a time limit to file a claim  for compensation. 7. How can I learn more?  Ask your health care provider.  Call your local or state health department.  Contact the Centers for Disease Control and Prevention (CDC):  Call (617)343-75271-214 506 7024 (1-800-CDC-INFO) or  Visit CDC's website at BiotechRoom.com.cywww.cdc.gov/flu CDC Vaccine Information Statement (Interim) Inactivated Influenza Vaccine (04/11/2013) Document Released: 06/04/2006 Document Revised: 12/25/2013 Document Reviewed: 07/28/2013 [facility] Patient Information 2015 [place]. This information is not intended to replace advice given to you by your health care provider. Make sure you discuss any questions you have with your health care provider.    Influenza Vaccine (Flu Vaccine, Inactivated or Recombinant) 2014-2015: What You Need to Know 1. Why get vaccinated? Influenza ("flu") is a contagious disease that spreads around the [place] every winter, usually between October and May. Flu is caused by influenza viruses, and is spread mainly by coughing, sneezing, and close contact. Anyone can get flu, but the risk of getting flu is highest among children. Symptoms come on suddenly and may last several days. They can include:  fever/chills  sore throat  muscle aches  fatigue  cough  headache  runny or stuffy nose Flu can make some people much sicker than others. These people include young children, people 5565 and older, pregnant women, and people with certain health conditions-such as heart, lung or kidney disease, nervous system disorders, or a weakened immune system. Flu vaccination is especially important for these people, and anyone in close contact with them. Flu can also lead to pneumonia, and make existing medical conditions worse. It can cause diarrhea and seizures in children. Each year thousands of people in the [place] die from flu, and many more are hospitalized. Flu vaccine is the best protection against flu and its complications. Flu vaccine also  helps prevent spreading flu from person to person. 2. Inactivated and recombinant flu vaccines You are getting an injectable flu vaccine, which is either an "inactivated" or "recombinant" vaccine. These vaccines do not contain any live influenza virus. They are given by injection with a needle, and often called the "flu shot."  A different live, attenuated (weakened) influenza vaccine is sprayed into the nostrils. This vaccine is described in a separate Vaccine Information Statement. Flu vaccination is recommended every year. Some children 6 months through 488 years of age might need two doses during one year. Flu viruses are always changing. Each year's flu vaccine is made to protect against 3 or 4 viruses that are likely to cause disease that year. Flu vaccine cannot prevent all cases of flu, but it is the best defense against the disease.  It takes about 2 weeks for protection to develop after the vaccination, and protection lasts several months to a year. Some illnesses that are not caused by influenza virus are often mistaken for flu. Flu vaccine will not prevent these illnesses. It can only prevent influenza. Some inactivated flu vaccine contains a very small amount of a mercury-based preservative called thimerosal. Studies have shown that thimerosal in vaccines  is not harmful, but flu vaccines that do not contain a preservative are available. 3. Some people should not get this vaccine Tell the person who gives you the vaccine:  If you have any severe, life-threatening allergies. If you ever had a life-threatening allergic reaction after a dose of flu vaccine, or have a severe allergy to any part of this vaccine, including (for example) an allergy to gelatin, antibiotics, or eggs, you may be advised not to get vaccinated. Most, but not all, types of flu vaccine contain a small amount of egg protein.  If you ever had Guillain-Barr Syndrome (a severe paralyzing illness, also called GBS). Some people  with a history of GBS should not get this vaccine. This should be discussed with your doctor.  If you are not feeling well. It is usually okay to get flu vaccine when you have a mild illness, but you might be advised to wait until you feel better. You should come back when you are better. 4. Risks of a vaccine reaction With a vaccine, like any medicine, there is a chance of side effects. These are usually mild and go away on their own. Problems that could happen after any vaccine:  Brief fainting spells can happen after any medical procedure, including vaccination. Sitting or lying down for about 15 minutes can help prevent fainting, and injuries caused by a fall. Tell your doctor if you feel dizzy, or have vision changes or ringing in the ears.  Severe shoulder pain and reduced range of motion in the arm where a shot was given can happen, very rarely, after a vaccination.  Severe allergic reactions from a vaccine are very rare, estimated at less than 1 in a million doses. If one were to occur, it would usually be within a few minutes to a few hours after the vaccination. Mild problems following inactivated flu vaccine:  soreness, redness, or swelling where the shot was given  hoarseness  sore, red or itchy eyes  cough  fever  aches  headache  itching  fatigue If these problems occur, they usually begin soon after the shot and last 1 or 2 days. Moderate problems following inactivated flu vaccine:  Young children who get inactivated flu vaccine and pneumococcal vaccine (PCV13) at the same time may be at increased risk for seizures caused by fever. Ask your doctor for more information. Tell your doctor if a child who is getting flu vaccine has ever had a seizure. Inactivated flu vaccine does not contain live flu virus, so you cannot get the flu from this vaccine. As with any medicine, there is a very remote chance of a vaccine causing a serious injury or death. The safety of  vaccines is always being monitored. For more information, visit: http://floyd.org/ 5. What if there is a serious reaction? What should I look for?  Look for anything that concerns you, such as signs of a severe allergic reaction, very high fever, or behavior changes. Signs of a severe allergic reaction can include hives, swelling of the face and throat, difficulty breathing, a fast heartbeat, dizziness, and weakness. These would start a few minutes to a few hours after the vaccination. What should I do?  If you think it is a severe allergic reaction or other emergency that can't wait, call 9-1-1 and get the person to the nearest hospital. Otherwise, call your doctor.  Afterward, the reaction should be reported to the Vaccine Adverse Event Reporting System (VAERS). Your doctor should file this report, or you  can do it yourself through the VAERS web site at www.vaers.LAgents.no, or by calling 1-956 143 1782. VAERS does not give medical advice. 6. The National Vaccine Injury Compensation Program The [employer] Vaccine Injury Compensation Program (VICP) is a federal program that was created to compensate people who may have been injured by certain vaccines. Persons who believe they may have been injured by a vaccine can learn about the program and about filing a claim by calling 1-234-458-8936 or visiting the VICP website at SpiritualWord.at. There is a time limit to file a claim for compensation. 7. How can I learn more?  Ask your health care provider.  Call your local or state health department.  Contact the Centers for Disease Control and Prevention (CDC):  Call (681)851-9341 (1-800-CDC-INFO) or  Visit CDC's website at BiotechRoom.com.cy CDC Vaccine Information Statement (Interim) Inactivated Influenza Vaccine (04/11/2013) Document Released: 06/04/2006 Document Revised: 12/25/2013 Document Reviewed: 07/28/2013 [facility] Patient Information 2015 [place], [place]. This  information is not intended to replace advice given to you by your health care provider. Make sure you discuss any questions you have with your health care provider. Erectile Dysfunction Erectile dysfunction is the inability to get or sustain a good enough erection to have sexual intercourse. Erectile dysfunction may involve:  Inability to get an erection.  Lack of enough hardness to allow penetration.  Loss of the erection before sex is finished.  Premature ejaculation. CAUSES  Certain drugs, such as:  Pain relievers.  Antihistamines.  Antidepressants.  Blood pressure medicines.  Water pills (diuretics).  Ulcer medicines.  Muscle relaxants.  Illegal drugs.  Excessive drinking.  Psychological causes, such as:  Anxiety.  Depression.  Sadness.  Exhaustion.  Performance fear.  Stress.  Physical causes, such as:  Artery problems. This may include diabetes, smoking, liver disease, or atherosclerosis.  High blood pressure.  Hormonal problems, such as low testosterone.  Obesity.  Nerve problems. This may include back or pelvic injuries, diabetes mellitus, multiple sclerosis, or Parkinson disease. SYMPTOMS  Inability to get an erection.  Lack of enough hardness to allow penetration.  Loss of the erection before sex is finished.  Premature ejaculation.  Normal erections at some times, but with frequent unsatisfactory episodes.  Orgasms that are not satisfactory in sensation or frequency.  Low sexual satisfaction in either partner because of erection problems.  A curved penis occurring with erection. The curve may cause pain or may be too curved to allow for intercourse.  Never having nighttime erections. DIAGNOSIS Your caregiver can often diagnose this condition by:  Performing a physical exam to find other diseases or specific problems with the penis.  Asking you detailed questions about the problem.  Performing blood tests to check for  diabetes mellitus or to measure hormone levels.  Performing urine tests to find other underlying health conditions.  Performing an ultrasound exam to check for scarring.  Performing a test to check blood flow to the penis.  Doing a sleep study at home to measure nighttime erections. TREATMENT   You may be prescribed medicines by mouth.  You may be given medicine injections into the penis.  You may be prescribed a vacuum pump with a ring.  Penile implant surgery may be performed. You may receive:  An inflatable implant.  A semirigid implant.  Blood vessel surgery may be performed. HOME CARE INSTRUCTIONS  If you are prescribed oral medicine, you should take the medicine as prescribed. Do not increase the dosage without first discussing it with your physician.  If you are using  self-injections, be careful to avoid any veins that are on the surface of the penis. Apply pressure to the injection site for 5 minutes.  If you are using a vacuum pump, make sure you have read the instructions before using it. Discuss any questions with your physician before taking the pump home. SEEK MEDICAL CARE IF:  You experience pain that is not responsive to the pain medicine you have been prescribed.  You experience nausea or vomiting. SEEK IMMEDIATE MEDICAL CARE IF:   When taking oral or injectable medications, you experience an erection that lasts longer than 4 hours. If your physician is unavailable, go to the nearest emergency room for evaluation. An erection that lasts much longer than 4 hours can result in permanent damage to your penis.  You have pain that is severe.  You develop redness, severe pain, or severe swelling of your penis.  You have redness spreading up into your groin or lower abdomen.  You are unable to pass your urine. Document Released: 08/07/2000 Document Revised: 04/12/2013 Document Reviewed: 01/12/2013 [facility] Patient Information 2015 [place], [place]. This  information is not intended to replace advice given to you by your health care provider. Make sure you discuss any questions you have with your health care provider.

## 2014-07-13 NOTE — Progress Notes (Signed)
Subjective:    Patient ID: Terry JollyJoseph S Roupp, male    DOB: 06/24/1961, 53 y.o.   MRN: [id number]  HPI Here for cpe and meds. Had normal colonoscopy last year.Left knee causing some pain. XR showed medial joint space narrowing. He wants referral to ortho.   Review of Systems  Constitutional: Negative.   HENT: Positive for hearing loss. Negative for congestion, dental problem, drooling, ear discharge, ear pain, facial swelling, mouth sores, nosebleeds, postnasal drip, rhinorrhea and sinus pressure.   Eyes: Positive for visual disturbance. Negative for photophobia, pain, discharge, redness and itching.  Respiratory: Negative.   Cardiovascular: Negative.   Gastrointestinal: Negative.   Endocrine: Negative.   Genitourinary: Negative.   Musculoskeletal: Positive for joint swelling and gait problem. Negative for myalgias, back pain, arthralgias, neck pain and neck stiffness.  Skin: Negative.   Allergic/Immunologic: Negative.   Neurological: Negative.   Hematological: Negative.   Psychiatric/Behavioral: Negative.        Objective:   Physical Exam  Constitutional: He is oriented to person, place, and time. He appears well-developed and well-nourished. No distress.  HENT:  Head: Normocephalic and atraumatic.  Right Ear: External ear normal.  Left Ear: External ear normal.  Nose: Nose normal.  Mouth/Throat: Oropharynx is clear and moist.  Eyes: Conjunctivae and EOM are normal. Pupils are equal, round, and reactive to light.  Neck: Normal range of motion. Neck supple. No tracheal deviation present. No thyromegaly present.  Cardiovascular: Normal rate, regular rhythm, normal heart sounds and intact distal pulses.   No murmur heard. Pulmonary/Chest: Effort normal and breath sounds normal.  Abdominal: Soft. Bowel sounds are normal. There is no tenderness.  Genitourinary: Rectum normal, prostate normal and penis normal.  Musculoskeletal: Normal range of motion. He exhibits tenderness.    Lymphadenopathy:    He has no cervical adenopathy.  Neurological: He is alert and oriented to person, place, and time. He has normal reflexes. He displays normal reflexes. No cranial nerve deficit. He exhibits normal muscle tone. Coordination normal.  Skin: No rash noted.  Psychiatric: He has a normal mood and affect. His behavior is normal. Thought content normal.  Gross whisper good   Results for orders placed or performed in visit on 07/13/14  POCT CBC  Result Value Ref Range   WBC 6.3 4.6 - 10.2 K/uL   Lymph, poc 2.1 0.6 - 3.4   POC LYMPH PERCENT 33.4 10 - 50 %L   MID (cbc) 0.4 0 - 0.9   POC MID % 6.7 0 - 12 %M   POC Granulocyte 3.8 2 - 6.9   Granulocyte percent 59.9 37 - 80 %G   RBC 5.10 4.69 - 6.13 M/uL   Hemoglobin 15.6 14.1 - 18.1 g/dL   HCT, POC 14.746.6 82.943.5 - 53.7 %   MCV 91.4 80 - 97 fL   MCH, POC 30.5 27 - 31.2 pg   MCHC 33.4 31.8 - 35.4 g/dL   RDW, POC 56.212.5 %   Platelet Count, POC 303 142 - 424 K/uL   MPV 6.9 0 - 99.8 fL  POCT UA - Microscopic Only  Result Value Ref Range   WBC, Ur, HPF, POC 0-2    RBC, urine, microscopic 1-2    Bacteria, U Microscopic neg    Mucus, UA positive    Epithelial cells, urine per micros 0-1    Crystals, Ur, HPF, POC neg    Casts, Ur, LPF, POC neg    Yeast, UA neg   POCT urinalysis dipstick  Result Value Ref Range   Color, UA yellow    Clarity, UA clear    Glucose, UA neg    Bilirubin, UA neg    Ketones, UA neg    Spec Grav, UA 1.020    Blood, UA neg    pH, UA 6.0    Protein, UA neg    Urobilinogen, UA 0.2    Nitrite, UA neg    Leukocytes, UA Negative    EKG normal     Assessment & Plan:  RF meds

## 2014-07-14 LAB — PSA: PSA: 1.98 ng/mL (ref <=4.00)

## 2014-07-15 ENCOUNTER — Telehealth: Payer: Self-pay | Admitting: Physician Assistant

## 2014-07-15 NOTE — Telephone Encounter (Signed)
Patient states that during his visit Friday 07/13/2014 his Amlodipine was supposed to be sent to his pharmacy however they do not have it for him. Can he get a refill on this?   [phone]

## 2014-07-16 NOTE — Telephone Encounter (Signed)
Rx was sent at [facility]. Checked w/pt and he stated that he thinks pharm has it ready for him.

## 2015-09-02 ENCOUNTER — Other Ambulatory Visit: Payer: Self-pay | Admitting: Internal Medicine

## 2015-10-14 ENCOUNTER — Other Ambulatory Visit: Payer: Self-pay | Admitting: Internal Medicine

## 2015-11-04 ENCOUNTER — Other Ambulatory Visit: Payer: Self-pay | Admitting: Internal Medicine

## 2015-11-04 ENCOUNTER — Ambulatory Visit (INDEPENDENT_AMBULATORY_CARE_PROVIDER_SITE_OTHER): Payer: BLUE CROSS/BLUE SHIELD | Admitting: Physician Assistant

## 2015-11-04 ENCOUNTER — Ambulatory Visit (INDEPENDENT_AMBULATORY_CARE_PROVIDER_SITE_OTHER): Payer: BLUE CROSS/BLUE SHIELD

## 2015-11-04 VITALS — BP 166/98 | HR 87 | Temp 97.7°F | Resp 18 | Ht 69.0 in | Wt 214.0 lb

## 2015-11-04 DIAGNOSIS — Z1159 Encounter for screening for other viral diseases: Secondary | ICD-10-CM | POA: Diagnosis not present

## 2015-11-04 DIAGNOSIS — R0602 Shortness of breath: Secondary | ICD-10-CM | POA: Diagnosis not present

## 2015-11-04 DIAGNOSIS — Z0000 Encounter for general adult medical examination without abnormal findings: Secondary | ICD-10-CM | POA: Diagnosis not present

## 2015-11-04 DIAGNOSIS — Z130 Encounter for screening for diseases of the blood and blood-forming organs and certain disorders involving the immune mechanism: Secondary | ICD-10-CM

## 2015-11-04 DIAGNOSIS — Z13228 Encounter for screening for other metabolic disorders: Secondary | ICD-10-CM | POA: Diagnosis not present

## 2015-11-04 DIAGNOSIS — Z125 Encounter for screening for malignant neoplasm of prostate: Secondary | ICD-10-CM

## 2015-11-04 DIAGNOSIS — G4730 Sleep apnea, unspecified: Secondary | ICD-10-CM | POA: Diagnosis not present

## 2015-11-04 DIAGNOSIS — Z13220 Encounter for screening for lipoid disorders: Secondary | ICD-10-CM

## 2015-11-04 LAB — COMPLETE METABOLIC PANEL WITH GFR
ALBUMIN: 4.4 g/dL (ref 3.6–5.1)
ALK PHOS: 70 U/L (ref 40–115)
ALT: 52 U/L — ABNORMAL HIGH (ref 9–46)
AST: 25 U/L (ref 10–35)
BILIRUBIN TOTAL: 0.3 mg/dL (ref 0.2–1.2)
BUN: 12 mg/dL (ref 7–25)
CALCIUM: 10 mg/dL (ref 8.6–10.3)
CO2: 24 mmol/L (ref 20–31)
Chloride: 104 mmol/L (ref 98–110)
Creat: 0.75 mg/dL (ref 0.70–1.33)
GFR, Est African American: >89 mL/min (ref >=60)
GLUCOSE: 95 mg/dL (ref 65–99)
Potassium: 4.1 mmol/L (ref 3.5–5.3)
SODIUM: 135 mmol/L (ref 135–146)
TOTAL PROTEIN: 7.7 g/dL (ref 6.1–8.1)

## 2015-11-04 LAB — CBC
HEMATOCRIT: 45.6 % (ref 39.0–52.0)
HEMOGLOBIN: 15.8 g/dL (ref 13.0–17.0)
MCH: 30.6 pg (ref 26.0–34.0)
MCHC: 34.6 g/dL (ref 30.0–36.0)
MCV: 88.2 fL (ref 78.0–100.0)
MPV: 9.5 fL (ref 8.6–12.4)
Platelets: 310 10*3/uL (ref 150–400)
RBC: 5.17 MIL/uL (ref 4.22–5.81)
RDW: 13.1 % (ref 11.5–15.5)
WBC: 6.7 10*3/uL (ref 4.0–10.5)

## 2015-11-04 LAB — LIPID PANEL
Cholesterol: 251 mg/dL — ABNORMAL HIGH (ref 125–200)
HDL: 26 mg/dL — AB (ref >=40)
Total CHOL/HDL Ratio: 9.7 Ratio — ABNORMAL HIGH (ref <=5.0)
Triglycerides: 557 mg/dL — ABNORMAL HIGH (ref <150)

## 2015-11-04 LAB — HEPATITIS C ANTIBODY: HCV AB: NEGATIVE

## 2015-11-04 NOTE — Progress Notes (Signed)
[facility] [address], [place] [address] [phone]  Date:  11/04/2015   Name:  Terry JollyJoseph S Guerra   DOB:  10/22/1960   MRN:  [id number]  PCP:  No PCP Per Patient    History of Present Illness:  Terry Guerra is a 55 y.o. male patient who [facility] exam and cc of sob.    Install carpets, sob.  No chest pains, nausea, diaphoresis associated.  He may have some palpitations associated.   He believes sleep apnea.  Girlfriend says he is not breathing in his sleep.  Mild seasonal allergies with weather changes--notices his vice is raspy and sneezing more.  He takes nothing for it.  No hx of asthma.  He is not followed by a cardiologist.    Non-smoker quit 15 years ago, 20 years of smoking 1-1.5 pk per day.  Left knee pain due to cartilage disease, and arthritis.  Was seen by Delbert Harnessmurphy wainer.  Given corticosteroid injections and another injection which did not help much.  Continues to have knee pain with heavy use.  He attempts a hinge knee brace which did not help much.  He has no swelling or redness.     Hearing: He has some hearing loss over time.  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    Diet: "anything I can get my hands on".  He will have carbs.  He gets fruit and vegetables daily.  Water intake is 3 glasses of water per day.  He is drinking a lot of sodas.  3-4 times per week 6-8 beers per day.       Sleep: 10:30-6pm.  He sleeps through the night.  Sleep aid.  [facility] brand.  He has some difficulty he denies racing thoughts.   He is taking amlodipine today.    BM: Normal without constipation or diarrhea.  No blood in the stool or melena  Urination: night frequency has increased--once.  No hematuria, weaknened stream, or dysuria.  Social activity: ride motorcycles. Children 56 year old daughter.  Girlfriend sexually active.  No pain or difficulty completing.    Patient Active Problem List   Diagnosis Date Noted  .  HTN (hypertension) 11/10/2011    Past Medical History  Diagnosis Date  . Hypertension     Past Surgical History  Procedure Laterality Date  . Hernia repair      Social History  Substance Use Topics  . Smoking status: Former Games developermoker  . Smokeless tobacco: None  . Alcohol Use: No    Family History  Problem Relation Age of Onset  . Cancer Mother   . Diabetes Father   . Allergies Brother     No Known Allergies  Medication list has been reviewed and updated.  Current Outpatient Prescriptions on File Prior to Visit  Medication Sig Dispense Refill  . amLODipine (NORVASC) 10 MG tablet TAKE 1 TABLET BY MOUTH DAILY 15 tablet 0  . tadalafil (CIALIS) 20 MG tablet Take 1 tablet (20 mg total) by mouth daily as needed for erectile dysfunction. (Patient not taking: Reported on 11/04/2015) 10 tablet 3   No current facility-administered medications on file prior to visit.    Review of Systems  Constitutional: Negative for fever and chills.  HENT: Positive for hearing loss. Negative for ear discharge, ear pain and sore throat.   Eyes: Negative for blurred vision and double vision.  Respiratory: Positive for shortness of breath. Negative  for cough and wheezing.   Cardiovascular: Negative for chest pain, palpitations and leg swelling.  Gastrointestinal: Negative for nausea, vomiting and diarrhea.  Genitourinary: Negative for dysuria, frequency and hematuria.  Musculoskeletal: Positive for joint pain (left knee).  Skin: Negative for itching and rash.  Neurological: Negative for dizziness and headaches.     Physical Examination: BP 160/90 mmHg  Pulse 87  Temp(Src) 97.7 F (36.5 C) (Oral)  Resp 18  Ht  (1.753 m)  Wt 214 lb (97.07 kg)  BMI 31.59 kg/m2  SpO2 97% Ideal Body Weight: Weight in (lb) to have BMI = 25: 168.9  Physical Exam  Constitutional: He is oriented to person, place, and time. He appears well-developed and well-nourished. No distress.  HENT:  Head:  Normocephalic and atraumatic.  Right Ear: Tympanic membrane, external ear and ear canal normal.  Left Ear: Tympanic membrane, external ear and ear canal normal.  Eyes: Conjunctivae and EOM are normal. Pupils are equal, round, and reactive to light.  Cardiovascular: Normal rate and regular rhythm.  Exam reveals no friction rub.   No murmur heard. Pulmonary/Chest: Effort normal. No respiratory distress. He has no wheezes.  Abdominal: Soft. Bowel sounds are normal. He exhibits no distension and no mass. There is no tenderness.  Musculoskeletal: Normal range of motion. He exhibits no edema or tenderness.  Neurological: He is alert and oriented to person, place, and time. He displays normal reflexes.  Skin: Skin is warm and dry. He is not diaphoretic.  Psychiatric: He has a normal mood and affect. His behavior is normal.     Assessment and Plan: Terry Guerra is a 55 y.o. male who is here today for annual physical exam and shortness of breath. -we will refer with urgent consult to cardiology at this time.  May likely need stress test. -sleep study consult appreciated.  This may affect his htn and needs to be evaluated. -general labs obtained to day regarding annual physical -I have asked that he contact me if he continues to have the knee pain.  A referral will be sent at that time.  In the meantime, he should ice the knee three times per day for 15 minutes.  Stretches advised. -Declines ent doctor at this time.   Annual physical exam - Plan: CBC, COMPLETE METABOLIC PANEL WITH GFR, Lipid panel, PSA, EKG 12-Lead, DG Chest 2 View, Hepatitis C antibody  Screening for deficiency anemia - Plan: CBC  Screening for metabolic disorder - Plan: COMPLETE METABOLIC PANEL WITH GFR  Screening for prostate cancer - Plan: PSA  SOB (shortness of breath) - Plan: EKG 12-Lead, DG Chest 2 View, Ambulatory referral to Cardiology  Screening for lipid disorders - Plan: Lipid panel  Sleep apnea - Plan:  Ambulatory referral to Sleep Studies  Need for hepatitis C screening test - Plan: Hepatitis C antibody  Trena Platt, PA-C [facility] 3/13/20173:56 PM

## 2015-11-04 NOTE — Patient Instructions (Addendum)
IF you received an x-ray today, you will receive an invoice from [facility]. Please contact [facility] at [phone] with questions or concerns regarding your invoice.   IF you received labwork today, you will receive an invoice from [facility]. Please contact [facility] at [phone] with questions or concerns regarding your invoice.   Our billing staff will not be able to assist you with questions regarding bills from these companies.  You will be contacted with the lab results as soon as they are available. The fastest way to get your results is to activate your My Chart account. Instructions are located on the last page of this paperwork. If you have not heard from us regarding the results in 2 weeks, please contact this office.  You can treat the allergies with 10mg  zyrtec over the counter.  Take until the season changes.  You can use flonase nasal spray for nasal congestion associated with allergies, and/or nasal saline spray.   Please limit your beers to 2. Please await contact for the sleep study appointment and the immediate cardiology appointment. I would like you to attempt 64 oz of water per day.    Keeping you healthy  Get these tests  Blood pressure- Have your blood pressure checked once a year by your healthcare provider.  Normal blood pressure is 120/80  Weight- Have your body mass index (BMI) calculated to screen for obesity.  BMI is a measure of body fat based on height and weight. You can also calculate your own BMI at [email or web address]  Cholesterol- Have your cholesterol checked every year.  Diabetes- Have your blood sugar checked regularly if you have high blood pressure, high cholesterol, have a family history of diabetes or if you are overweight.  Screening for Colon Cancer- Colonoscopy starting at age 56.  Screening may begin sooner depending on your family history and other health conditions. Follow up  colonoscopy as directed by your Gastroenterologist.  Screening for Prostate Cancer- Both blood work (PSA) and a rectal exam help screen for Prostate Cancer.  Screening begins at age 56 with African-American men and at age 750 with Caucasian men.  Screening may begin sooner depending on your family history.  Take these medicines  Aspirin- One aspirin daily can help prevent Heart disease and Stroke.  Flu shot- Every fall.  Tetanus- Every 10 years.  Zostavax- Once after the age of 56 to prevent Shingles.  Pneumonia shot- Once after the age of 56; if you are younger than 7165, ask your healthcare provider if you need a Pneumonia shot.  Take these steps  Don't smoke- If you do smoke, talk to your doctor about quitting.  For tips on how to quit, go to www.smokefree.gov or call 1-800-QUIT-NOW.  Be physically active- Exercise 5 days a week for at least 30 minutes.  If you are not already physically active start slow and gradually work up to 30 minutes of moderate physical activity.  Examples of moderate activity include walking briskly, mowing the yard, dancing, swimming, bicycling, etc.  Eat a healthy diet- Eat a variety of healthy food such as fruits, vegetables, low fat milk, low fat cheese, yogurt, lean meant, poultry, fish, beans, tofu, etc. For more information go to www.thenutritionsource.org  Drink alcohol in moderation- Limit alcohol intake to less than two drinks a day. Never drink and drive.  Dentist- Brush and floss twice daily; visit your dentist twice a year.  Depression- Your emotional health is as important as your physical health. If you're  feeling down, or losing interest in things you would normally enjoy please talk to your healthcare provider.  Eye exam- Visit your eye doctor every year.  Safe sex- If you may be exposed to a sexually transmitted infection, use a condom.  Seat belts- Seat belts can save your life; always wear one.  Smoke/Carbon Monoxide detectors- These  detectors need to be installed on the appropriate level of your home.  Replace batteries at least once a year.  Skin cancer- When out in the sun, cover up and use sunscreen 15 SPF or higher.  Violence- If anyone is threatening you, please tell your healthcare provider.  Living Will/ Health care power of attorney- Speak with your healthcare provider and family.

## 2015-11-05 LAB — PSA: PSA: 2.23 ng/mL (ref <=4.00)

## 2015-12-07 ENCOUNTER — Telehealth: Payer: Self-pay

## 2015-12-07 NOTE — Telephone Encounter (Signed)
Patient states that he normally gets 90 pills of his amLODipine (NORVASC) 10 MG tablet but the last time they only gave him 15 and he would like someone to call him about this so he can find out why.   His call back number is [phone]

## 2015-12-09 ENCOUNTER — Telehealth: Payer: Self-pay

## 2015-12-09 MED ORDER — AMLODIPINE BESYLATE 10 MG PO TABS: 10.0000 mg | ORAL_TABLET | Freq: Every day | ORAL | Status: DC

## 2015-12-09 NOTE — Telephone Encounter (Signed)
I am not sure, possible mistake. Rx for 90 days sent.

## 2015-12-09 NOTE — Telephone Encounter (Signed)
Pt is needing to talk with someone abut increasing his amlodipine  Best number [phone]

## 2015-12-10 NOTE — Telephone Encounter (Signed)
Spoke with pt, he states someone already called him.

## 2015-12-17 ENCOUNTER — Ambulatory Visit (INDEPENDENT_AMBULATORY_CARE_PROVIDER_SITE_OTHER): Payer: BLUE CROSS/BLUE SHIELD | Admitting: Neurology

## 2015-12-17 ENCOUNTER — Encounter: Payer: Self-pay | Admitting: Neurology

## 2015-12-17 VITALS — BP 144/91 | HR 87 | Resp 20 | Ht 69.0 in | Wt 213.0 lb

## 2015-12-17 DIAGNOSIS — R0681 Apnea, not elsewhere classified: Secondary | ICD-10-CM | POA: Diagnosis not present

## 2015-12-17 DIAGNOSIS — G4710 Hypersomnia, unspecified: Secondary | ICD-10-CM | POA: Diagnosis not present

## 2015-12-17 DIAGNOSIS — R0683 Snoring: Secondary | ICD-10-CM | POA: Diagnosis not present

## 2015-12-17 DIAGNOSIS — R351 Nocturia: Secondary | ICD-10-CM | POA: Diagnosis not present

## 2015-12-17 DIAGNOSIS — G2581 Restless legs syndrome: Secondary | ICD-10-CM

## 2015-12-17 DIAGNOSIS — G478 Other sleep disorders: Secondary | ICD-10-CM

## 2015-12-17 NOTE — Progress Notes (Signed)
Subjective:    Patient ID: Terry Guerra is a 55 y.o. male.  HPI     Huston Foley, MD, PhD [facility] [address] [address] [place], [place] [address]  Dear [name],   I saw your patient, Terry Guerra, upon your kind request in my neurologic clinic today for initial consultation of his sleep disorder, in particular, concern for underlying obstructive sleep apnea. The patient is unaccompanied today. As you know, Terry Guerra is a 55 year old right-handed gentleman with an underlying medical history of hypertension and obesity, who reports snoring and excessive daytime somnolence. I reviewed your office note from 11/04/2015.His main complaint about his sleep is loud snoring and lack of energy during the day. He feels like he is not motivated to do anything as he does not have the energy. Blood pressure values have been borderline. He has been on amlodipine and takes it regularly he reports. His Epworth sleepiness score is 5 out of 24 today, his fatigue score is 42 out of 63. Bedtime is around 11 PM. He has had sleep onset difficulties for years and has been taking over-the-counter sleep aid for years, 2-3 pills on an average night. He denies morning headaches but has nocturia, usually 2-3 times per night. Rise time is around 7 AM but he does not wake up rested. Sleep is interrupted. He does not feel like he gets deep sleep. He has woken himself up with his snoring and his girlfriend has noted witnessed apneic pauses while he is asleep and has woken him up because of his apneas. She sometimes has to leave the bedroom and closed the door because of his loud snoring. There is no family history of OSA that he can recall. He does have restless leg symptoms occasionally with the inability to relax his legs at night. He does not always drink enough water. He drinks beer during the week and on weekends, usually about 12 be appropriate. He quit smoking in 1999. He drinks coffee  1 cup a day and sometimes soda one a day. He lives alone, he has 1 child, he works as a Designer, multimedia. He has tried a prescription sleep aid but did not feel good on it. He has bothersome arthritis in the left knee and has seen orthopedics at [facility]. He is status post knee injections with steroids, and Synvisc injections 3.   His Past Medical History Is Significant For: Past Medical History  Diagnosis Date  . Hypertension     His Past Surgical History Is Significant For: Past Surgical History  Procedure Laterality Date  . Hernia repair    . Nasal septum surgery      His Family History Is Significant For: Family History  Problem Relation Age of Onset  . Cancer Mother   . Diabetes Father   . Allergies Brother     His Social History Is Significant For: Social History   Social History  . Marital Status: Married    Spouse Name: Erskine Squibb   . Number of Children: 1  . Years of Education: 12   Occupational History  . Carpenter     Social History Main Topics  . Smoking status: Former Games developer  . Smokeless tobacco: None     Comment: Quit 1999  . Alcohol Use: 7.2 oz/week    12 Standard drinks or equivalent per week  . Drug Use: No  . Sexual Activity: Yes    Birth Control/ Protection: None   Other Topics Concern  .  None   Social History Narrative   Drinks 1 cup of coffee daily     His Allergies Are:  No Known Allergies:   His Current Medications Are:  Outpatient Encounter Prescriptions as of 12/17/2015  Medication Sig  . amLODipine (NORVASC) 10 MG tablet Take 1 tablet (10 mg total) by mouth daily.  . DiphenhydrAMINE HCl, Sleep, ([facility] SLEEP AID PO) Take by mouth.  . naproxen sodium (ANAPROX) 220 MG tablet Take 220 mg by mouth 2 (two) times daily with a meal.  . [DISCONTINUED] tadalafil (CIALIS) 20 MG tablet Take 1 tablet (20 mg total) by mouth daily as needed for erectile dysfunction. (Patient not taking: Reported on 11/04/2015)   No facility-administered  encounter medications on file as of 12/17/2015.  :  Review of Systems:  Out of a complete 14 point review of systems, all are reviewed and negative with the exception of these symptoms as listed below:   Review of Systems  HENT: Positive for hearing loss.   Respiratory: Positive for cough and shortness of breath.   Neurological:       Memory loss Patient has trouble falling and staying asleep, witnessed apnea, snoring, wakes up feeling tired, daytime tiredness, denies taking naps.   Psychiatric/Behavioral:       Decreased energy, disinterest in activities    Epworth Sleepiness Scale 0= would never doze 1= slight chance of dozing 2= moderate chance of dozing 3= high chance of dozing  Sitting and reading:1 Watching TV:1 Sitting inactive in a public place (ex. Theater or meeting):0 As a passenger in a car for an hour without a break:1 Lying down to rest in the afternoon:1 Sitting and talking to someone:0 Sitting quietly after lunch (no alcohol):1 In a car, while stopped in traffic:0 Total:5  Objective:  Neurologic Exam  Physical Exam Physical Examination:   Filed Vitals:   12/17/15 0901  BP: 144/91  Pulse: 87  Resp: 20    General Examination: The patient is a very pleasant 55 y.o. male in no acute distress. He appears well-developed and well-nourished and well groomed.   HEENT: Normocephalic, atraumatic, pupils are equal, round and reactive to light and accommodation. Funduscopic exam is normal with sharp disc margins noted. Extraocular tracking is good without limitation to gaze excursion or nystagmus noted. Normal smooth pursuit is noted. Hearing is grossly intact. Tympanic membranes are clear bilaterally. Face is symmetric with normal facial animation and normal facial sensation. Speech is clear with no dysarthria noted. There is no hypophonia. There is no lip, neck/head, jaw or voice tremor. Neck is supple with full range of passive and active motion. There are no  carotid bruits on auscultation. Oropharynx exam reveals: mild mouth dryness, adequate dental hygiene and moderate airway crowding, due to thicker soft palate, larger uvula, smaller airway entry. Mallampati is class II. Tongue protrudes centrally and palate elevates symmetrically. Tonsils are smaller in size. Neck size is 17.5 inches. He has a Mild overbite. Nasal inspection reveals no significant nasal mucosal bogginess but mild septal deviation to the L.   Chest: Clear to auscultation without wheezing, rhonchi or crackles noted.  Heart: S1+S2+0, regular and normal without murmurs, rubs or gallops noted.   Abdomen: Soft, non-tender and non-distended with normal bowel sounds appreciated on auscultation.  Extremities: There is no pitting edema in the distal lower extremities bilaterally. Pedal pulses are intact.  Skin: Warm and dry without trophic changes noted. There are no varicose veins.  Musculoskeletal: exam reveals no obvious joint deformities, tenderness or joint  swelling or erythema.   Neurologically:  Mental status: The patient is awake, alert and oriented in all 4 spheres. His immediate and remote memory, attention, language skills and fund of knowledge are appropriate. There is no evidence of aphasia, agnosia, apraxia or anomia. Speech is clear with normal prosody and enunciation. Thought process is linear. Mood is normal and affect is normal.  Cranial nerves II - XII are as described above under HEENT exam. In addition: shoulder shrug is normal with equal shoulder height noted. Motor exam: Normal bulk, strength and tone is noted. There is no drift, tremor or rebound. Romberg is negative. Reflexes are 2+ throughout. Fine motor skills and coordination: intact with normal finger taps, normal hand movements, normal rapid alternating patting, normal foot taps and normal foot agility.  Cerebellar testing: No dysmetria or intention tremor on finger to nose testing. Heel to shin is unremarkable  bilaterally. There is no truncal or gait ataxia.  Sensory exam: intact to light touch, pinprick, vibration, temperature sense  in the upper and lower extremities.  Gait, station and balance: He stands easily. No veering to one side is noted. No leaning to one side is noted. Posture is age-appropriate and stance is narrow based. Gait shows normal stride length and normal pace. No problems turning are noted. Tandem walk is unremarkable. Intact toe and heel stance is noted.               Assessment and Plan:  In summary, Terry Guerra is a very pleasant 55 y.o.-year old male with an underlying medical history of hypertension, knee arthritis and obesity, whose history and physical exam are in keeping with obstructive sleep apnea (OSA). I had a long chat with the patient about my findings and the diagnosis of OSA, its prognosis and treatment options. We talked about medical treatments, surgical interventions and non-pharmacological approaches. I explained in particular the risks and ramifications of untreated moderate to severe OSA, especially with respect to developing cardiovascular disease down the Road, including congestive heart failure, difficult to treat hypertension, cardiac arrhythmias, or stroke. Even type 2 diabetes has, in part, been linked to untreated OSA. Symptoms of untreated OSA include daytime sleepiness, memory problems, mood irritability and mood disorder such as depression and anxiety, lack of energy, as well as recurrent headaches, especially morning headaches. We talked about trying to maintain a healthy lifestyle in general, as well as the importance of weight control. I encouraged the patient to eat healthy, exercise daily and keep well hydrated, to keep a scheduled bedtime and wake time routine, to not skip any meals and eat healthy snacks in between meals. I advised the patient not to drive when feeling sleepy. I recommended the following at this time: sleep study with potential  positive airway pressure titration. (We will score hypopneas at 3% and split the sleep study into diagnostic and treatment portion, if the estimated. 2 hour AHI is >15/h).   Of note, he is somewhat apprehensive about sleep study testing in lab. He is reassured in that regard. I tried to reduce his apprehension. He is also advised that he can take his over-the-counter sleep aid for the study. I explained the sleep test procedure to the patient and also outlined possible surgical and non-surgical treatment options of OSA, including the use of a custom-made dental device (which would require a referral to a specialist dentist or oral surgeon), upper airway surgical options, such as pillar implants, radiofrequency surgery, tongue base surgery, and UPPP (which would involve a  referral to an ENT surgeon). Rarely, jaw surgery such as mandibular advancement may be considered.  I also explained the CPAP treatment option to the patient, who indicated that he would be willing to try CPAP if the need arises. I explained the importance of being compliant with PAP treatment, not only for insurance purposes but primarily to improve His symptoms, and for the patient's long term health benefit, including to reduce His cardiovascular risks. I answered all his questions today and the patient was in agreement. I would like to see him back after the sleep study is completed and encouraged him to call with any interim questions, concerns, problems or updates.   Thank you very much for allowing me to participate in the care of this nice patient. If I can be of any further assistance to you please do not hesitate to call me at [phone].  Sincerely,   Huston Foley, MD, PhD

## 2015-12-17 NOTE — Patient Instructions (Signed)

## 2015-12-20 ENCOUNTER — Telehealth: Payer: Self-pay

## 2015-12-20 NOTE — Telephone Encounter (Signed)
LMOVM for pt to call clinical TL for results of his labs and we have a couple questions to ask per results message by Judeth CornfieldStephanie

## 2016-01-07 ENCOUNTER — Ambulatory Visit (INDEPENDENT_AMBULATORY_CARE_PROVIDER_SITE_OTHER): Payer: BLUE CROSS/BLUE SHIELD | Admitting: Neurology

## 2016-01-07 DIAGNOSIS — G4720 Circadian rhythm sleep disorder, unspecified type: Secondary | ICD-10-CM

## 2016-01-07 DIAGNOSIS — G4710 Hypersomnia, unspecified: Secondary | ICD-10-CM | POA: Diagnosis not present

## 2016-01-07 DIAGNOSIS — G479 Sleep disorder, unspecified: Secondary | ICD-10-CM

## 2016-01-07 DIAGNOSIS — G4733 Obstructive sleep apnea (adult) (pediatric): Secondary | ICD-10-CM

## 2016-01-07 DIAGNOSIS — G4761 Periodic limb movement disorder: Secondary | ICD-10-CM

## 2016-01-10 ENCOUNTER — Telehealth: Payer: Self-pay | Admitting: Neurology

## 2016-01-10 DIAGNOSIS — G4761 Periodic limb movement disorder: Secondary | ICD-10-CM

## 2016-01-10 DIAGNOSIS — G4733 Obstructive sleep apnea (adult) (pediatric): Secondary | ICD-10-CM

## 2016-01-10 NOTE — Telephone Encounter (Signed)
Patient referred by Trena PlattStephanie English, seen by me on 12/17/15, diagnostic PSG on 01/07/16, ins: [employer].   Please call and notify the patient that the recent sleep study did confirm the diagnosis of mod to severe obstructive sleep apnea and that I recommend treatment for this in the form of CPAP. This will require a repeat sleep study for proper titration and mask fitting. Please explain to patient and arrange for a CPAP titration study. I have placed an order in the chart. Thanks, and [facility].  Huston FoleySaima Yusif Gnau, MD, PhD [facility])

## 2016-01-13 NOTE — Telephone Encounter (Signed)
I sent report to PCP.  Patient called back and is aware of results and recommendations. He is willing to proceed with titration study.

## 2016-01-13 NOTE — Telephone Encounter (Signed)
LM to call back for results

## 2016-02-10 NOTE — Telephone Encounter (Signed)
Patient is calling about scheduling titration study, order dated 01/10/16.

## 2016-02-10 NOTE — Telephone Encounter (Signed)
Patient is calling back and states no one has called him for the additional sleep study. Please call @[phone].

## 2016-06-11 ENCOUNTER — Other Ambulatory Visit: Payer: Self-pay | Admitting: Physician Assistant

## 2016-07-27 DIAGNOSIS — H40033 Anatomical narrow angle, bilateral: Secondary | ICD-10-CM | POA: Diagnosis not present

## 2016-07-27 DIAGNOSIS — H04123 Dry eye syndrome of bilateral lacrimal glands: Secondary | ICD-10-CM | POA: Diagnosis not present

## 2016-08-31 DIAGNOSIS — L239 Allergic contact dermatitis, unspecified cause: Secondary | ICD-10-CM | POA: Diagnosis not present

## 2016-09-20 ENCOUNTER — Other Ambulatory Visit: Payer: Self-pay | Admitting: Physician Assistant

## 2016-10-11 ENCOUNTER — Other Ambulatory Visit: Payer: Self-pay | Admitting: Physician Assistant

## 2016-10-12 NOTE — Telephone Encounter (Signed)
PATIENT IS CALLING TO INQUIRE ABOUT THE REFILL ON HIS AMLODIPINE 10 MG. IT WAS DENIED IN JAN. 2018 BECAUSE HE NEEDS AN OFFICE VISIT. (I TOLD THE PATIENT THIS AND HE SAID HE USUALLY GETS IT FILLED FOR A YEAR WHEN HE HAS HIS ANNUAL PE WHICH IS NOT UNTIL MARCH). HE SAID HE IS WORKING OUT OF TOWN AND IS NOT SURE WHEN HE WILL RETURN HOME. BEST PHONE [phone] (CELL) [facility] CHOICE IS [facility] [address]. MBC

## 2016-11-09 ENCOUNTER — Other Ambulatory Visit: Payer: Self-pay | Admitting: Physician Assistant

## 2016-11-16 ENCOUNTER — Telehealth: Payer: Self-pay | Admitting: Family Medicine

## 2016-11-16 MED ORDER — AMLODIPINE BESYLATE 10 MG PO TABS: 10.0000 mg | ORAL_TABLET | Freq: Every day | ORAL | 0 refills | Status: DC

## 2016-11-16 NOTE — Telephone Encounter (Signed)
Pt calling for a refill on his amlodipine he is going out of town [address]

## 2016-12-09 ENCOUNTER — Ambulatory Visit (INDEPENDENT_AMBULATORY_CARE_PROVIDER_SITE_OTHER): Payer: BLUE CROSS/BLUE SHIELD | Admitting: Emergency Medicine

## 2016-12-09 VITALS — BP 194/106 | HR 91 | Temp 97.5°F | Resp 16 | Ht 69.0 in | Wt 212.2 lb

## 2016-12-09 DIAGNOSIS — I10 Essential (primary) hypertension: Secondary | ICD-10-CM | POA: Diagnosis not present

## 2016-12-09 DIAGNOSIS — E785 Hyperlipidemia, unspecified: Secondary | ICD-10-CM | POA: Diagnosis not present

## 2016-12-09 MED ORDER — PRAVASTATIN SODIUM 40 MG PO TABS: 40.0000 mg | ORAL_TABLET | Freq: Every day | ORAL | 3 refills | Status: DC

## 2016-12-09 MED ORDER — AMLODIPINE BESYLATE 10 MG PO TABS: 10.0000 mg | ORAL_TABLET | Freq: Every day | ORAL | 5 refills | Status: DC

## 2016-12-09 NOTE — Patient Instructions (Signed)
Hypertension °Hypertension is another name for high blood pressure. High blood pressure forces your heart to work harder to pump blood. This can cause problems over time. °There are two numbers in a blood pressure reading. There is a top number (systolic) over a bottom number (diastolic). It is best to have a blood pressure below 120/80. Healthy choices can help lower your blood pressure. You may need medicine to help lower your blood pressure if: °· Your blood pressure cannot be lowered with healthy choices. °· Your blood pressure is higher than 130/80. °Follow these instructions at home: °Eating and drinking  °· If directed, follow the DASH eating plan. This diet includes: °¨ Filling half of your plate at each meal with fruits and vegetables. °¨ Filling one quarter of your plate at each meal with whole grains. Whole grains include whole wheat pasta, brown rice, and whole grain bread. °¨ Eating or drinking low-fat dairy products, such as skim milk or low-fat yogurt. °¨ Filling one quarter of your plate at each meal with low-fat (lean) proteins. Low-fat proteins include fish, skinless chicken, eggs, beans, and tofu. °¨ Avoiding fatty meat, cured and processed meat, or chicken with skin. °¨ Avoiding premade or processed food. °· Eat less than 1,500 mg of salt (sodium) a day. °· Limit alcohol use to no more than 1 drink a day for nonpregnant women and 2 drinks a day for men. One drink equals 12 oz of beer, 5 oz of wine, or 1½ oz of hard liquor. °Lifestyle  °· Work with your doctor to stay at a healthy weight or to lose weight. Ask your doctor what the best weight is for you. °· Get at least 30 minutes of exercise that causes your heart to beat faster (aerobic exercise) most days of the week. This may include walking, swimming, or biking. °· Get at least 30 minutes of exercise that strengthens your muscles (resistance exercise) at least 3 days a week. This may include lifting weights or pilates. °· Do not use any  products that contain nicotine or tobacco. This includes cigarettes and e-cigarettes. If you need help quitting, ask your doctor. °· Check your blood pressure at home as told by your doctor. °· Keep all follow-up visits as told by your doctor. This is important. °Medicines  °· Take over-the-counter and prescription medicines only as told by your doctor. Follow directions carefully. °· Do not skip doses of blood pressure medicine. The medicine does not work as well if you skip doses. Skipping doses also puts you at risk for problems. °· Ask your doctor about side effects or reactions to medicines that you should watch for. °Contact a doctor if: °· You think you are having a reaction to the medicine you are taking. °· You have headaches that keep coming back (recurring). °· You feel dizzy. °· You have swelling in your ankles. °· You have trouble with your vision. °Get help right away if: °· You get a very bad headache. °· You start to feel confused. °· You feel weak or numb. °· You feel faint. °· You get very bad pain in your: °¨ Chest. °¨ Belly (abdomen). °· You throw up (vomit) more than once. °· You have trouble breathing. °Summary °· Hypertension is another name for high blood pressure. °· Making healthy choices can help lower blood pressure. If your blood pressure cannot be controlled with healthy choices, you may need to take medicine. °This information is not intended to replace advice given to you by your   health care provider. Make sure you discuss any questions you have with your health care provider. °Document Released: 01/27/2008 Document Revised: 07/08/2016 Document Reviewed: 07/08/2016 °Elsevier Interactive Patient Education © 2017 [employer] ° °

## 2016-12-09 NOTE — Progress Notes (Signed)
Terry Guerra 56 y.o.   Chief Complaint  Patient presents with  . Medication Refill    Norvasc    HISTORY OF PRESENT ILLNESS: This is a 56 y.o. male needs Norvasc refilled; has not being taken it correctly; skipping days. Asymptomatic. Past labs reviewed. Has increased lipids; no treatment.  HPI   Prior to Admission medications   Medication Sig Start Date End Date Taking? Authorizing Provider  amLODipine (NORVASC) 10 MG tablet Take 1 tablet (10 mg total) by mouth daily. 11/16/16  Yes Shade Flood, MD  DiphenhydrAMINE HCl, Sleep, ([facility] SLEEP AID PO) Take by mouth.    Historical Provider, MD  naproxen sodium (ANAPROX) 220 MG tablet Take 220 mg by mouth 2 (two) times daily with a meal.    Historical Provider, MD    No Known Allergies  Patient Active Problem List   Diagnosis Date Noted  . HTN (hypertension) 11/10/2011    Past Medical History:  Diagnosis Date  . Hypertension     Past Surgical History:  Procedure Laterality Date  . HERNIA REPAIR    . NASAL SEPTUM SURGERY      Social History   Social History  . Marital status: Married    Spouse name: Erskine Squibb   . Number of children: 1  . Years of education: 57   Occupational History  . Carpenter     Social History Main Topics  . Smoking status: Former Games developer  . Smokeless tobacco: Never Used     Comment: Quit 1999  . Alcohol use 7.2 oz/week    12 Standard drinks or equivalent per week  . Drug use: No  . Sexual activity: Yes    Birth control/ protection: None   Other Topics Concern  . Not on file   Social History Narrative   Drinks 1 cup of coffee daily     Family History  Problem Relation Age of Onset  . Cancer Mother   . Diabetes Father   . Allergies Brother      Review of Systems  Constitutional: Negative.  Negative for chills, fever and weight loss.  HENT: Negative.  Negative for congestion, nosebleeds, sinus pain and sore throat.   Eyes: Negative.  Negative for blurred vision, double vision,  discharge and redness.  Respiratory: Negative.  Negative for cough, shortness of breath and wheezing.   Cardiovascular: Negative.  Negative for chest pain, orthopnea, claudication, leg swelling and PND.  Gastrointestinal: Negative.  Negative for diarrhea, nausea and vomiting.  Genitourinary: Negative for dysuria and hematuria.  Musculoskeletal: Negative for back pain, myalgias and neck pain.  Skin: Negative for rash.  Neurological: Negative.  Negative for dizziness, sensory change, focal weakness and headaches.  Endo/Heme/Allergies: Negative.   Psychiatric/Behavioral: Negative.   All other systems reviewed and are negative.  Vitals:   12/09/16 0905  BP: (!) 194/106  Pulse: 91  Resp: 16  Temp: 97.5 F (36.4 C)     Physical Exam  Constitutional: He is oriented to person, place, and time. He appears well-developed and well-nourished.  HENT:  Head: Normocephalic and atraumatic.  Nose: Nose normal.  Mouth/Throat: Oropharynx is clear and moist. No oropharyngeal exudate.  Eyes: Conjunctivae and EOM are normal. Pupils are equal, round, and reactive to light.  Neck: Normal range of motion. Neck supple. No JVD present. Carotid bruit is not present.  Cardiovascular: Normal rate, regular rhythm, normal heart sounds and intact distal pulses.   Pulmonary/Chest: Effort normal and breath sounds normal.  Abdominal: Soft. Bowel sounds  are normal. He exhibits no distension. There is no tenderness.  Musculoskeletal: Normal range of motion. He exhibits no edema or tenderness.  Lymphadenopathy:    He has no cervical adenopathy.  Neurological: He is alert and oriented to person, place, and time. No sensory deficit. He exhibits normal muscle tone. Coordination normal.  Skin: Skin is warm and dry. Capillary refill takes less than 2 seconds.  Psychiatric: He has a normal mood and affect. His behavior is normal.  Vitals reviewed.    ASSESSMENT & PLAN: [place] was seen today for medication  refill.  Diagnoses and all orders for this visit:  Essential hypertension -     amLODipine (NORVASC) 10 MG tablet; Take 1 tablet (10 mg total) by mouth daily. -     CBC with Differential/Platelet -     Comprehensive metabolic panel -     Hemoglobin A1c  Dyslipidemia -     Lipid panel -     pravastatin (PRAVACHOL) 40 MG tablet; Take 1 tablet (40 mg total) by mouth daily.    Patient Instructions  Hypertension Hypertension is another name for high blood pressure. High blood pressure forces your heart to work harder to pump blood. This can cause problems over time. There are two numbers in a blood pressure reading. There is a top number (systolic) over a bottom number (diastolic). It is best to have a blood pressure below 120/80. Healthy choices can help lower your blood pressure. You may need medicine to help lower your blood pressure if:  Your blood pressure cannot be lowered with healthy choices.  Your blood pressure is higher than 130/80. Follow these instructions at home: Eating and drinking   If directed, follow the DASH eating plan. This diet includes:  Filling half of your plate at each meal with fruits and vegetables.  Filling one quarter of your plate at each meal with whole grains. Whole grains include whole wheat pasta, brown rice, and whole grain bread.  Eating or drinking low-fat dairy products, such as skim milk or low-fat yogurt.  Filling one quarter of your plate at each meal with low-fat (lean) proteins. Low-fat proteins include fish, skinless chicken, eggs, beans, and tofu.  Avoiding fatty meat, cured and processed meat, or chicken with skin.  Avoiding premade or processed food.  Eat less than 1,500 mg of salt (sodium) a day.  Limit alcohol use to no more than 1 drink a day for nonpregnant women and 2 drinks a day for men. One drink equals 12 oz of beer, 5 oz of wine, or 1 oz of hard liquor. Lifestyle   Work with your doctor to stay at a healthy weight or  to lose weight. Ask your doctor what the best weight is for you.  Get at least 30 minutes of exercise that causes your heart to beat faster (aerobic exercise) most days of the week. This may include walking, swimming, or biking.  Get at least 30 minutes of exercise that strengthens your muscles (resistance exercise) at least 3 days a week. This may include lifting weights or pilates.  Do not use any products that contain nicotine or tobacco. This includes cigarettes and e-cigarettes. If you need help quitting, ask your doctor.  Check your blood pressure at home as told by your doctor.  Keep all follow-up visits as told by your doctor. This is important. Medicines   Take over-the-counter and prescription medicines only as told by your doctor. Follow directions carefully.  Do not skip doses of blood  pressure medicine. The medicine does not work as well if you skip doses. Skipping doses also puts you at risk for problems.  Ask your doctor about side effects or reactions to medicines that you should watch for. Contact a doctor if:  You think you are having a reaction to the medicine you are taking.  You have headaches that keep coming back (recurring).  You feel dizzy.  You have swelling in your ankles.  You have trouble with your vision. Get help right away if:  You get a very bad headache.  You start to feel confused.  You feel weak or numb.  You feel faint.  You get very bad pain in your:  Chest.  Belly (abdomen).  You throw up (vomit) more than once.  You have trouble breathing. Summary  Hypertension is another name for high blood pressure.  Making healthy choices can help lower blood pressure. If your blood pressure cannot be controlled with healthy choices, you may need to take medicine. This information is not intended to replace advice given to you by your health care provider. Make sure you discuss any questions you have with your health care  provider. Document Released: 01/27/2008 Document Revised: 07/08/2016 Document Reviewed: 07/08/2016 Elsevier Interactive Patient Education  2017 Elsevier Inc.      Edwina Barth, MD [facility]

## 2016-12-10 LAB — CBC WITH DIFFERENTIAL/PLATELET
BASOS ABS: 0.1 10*3/uL (ref 0.0–0.2)
Basos: 1 %
EOS (ABSOLUTE): 0.2 10*3/uL (ref 0.0–0.4)
EOS: 5 %
HEMATOCRIT: 43.4 % (ref 37.5–51.0)
Hemoglobin: 15.4 g/dL (ref 13.0–17.7)
IMMATURE GRANULOCYTES: 0 %
Immature Grans (Abs): 0 10*3/uL (ref 0.0–0.1)
LYMPHS ABS: 1.4 10*3/uL (ref 0.7–3.1)
Lymphs: 27 %
MCH: 30.9 pg (ref 26.6–33.0)
MCHC: 35.5 g/dL (ref 31.5–35.7)
MCV: 87 fL (ref 79–97)
MONOS ABS: 0.5 10*3/uL (ref 0.1–0.9)
Monocytes: 9 %
Neutrophils Absolute: 3 10*3/uL (ref 1.4–7.0)
Neutrophils: 58 %
PLATELETS: 306 10*3/uL (ref 150–379)
RBC: 4.99 x10E6/uL (ref 4.14–5.80)
RDW: 13.5 % (ref 12.3–15.4)
WBC: 5.1 10*3/uL (ref 3.4–10.8)

## 2016-12-10 LAB — COMPREHENSIVE METABOLIC PANEL
ALT: 35 IU/L (ref 0–44)
AST: 25 IU/L (ref 0–40)
Albumin/Globulin Ratio: 1.6 (ref 1.2–2.2)
Albumin: 4.5 g/dL (ref 3.5–5.5)
Alkaline Phosphatase: 78 IU/L (ref 39–117)
BUN/Creatinine Ratio: 19 (ref 9–20)
BUN: 13 mg/dL (ref 6–24)
Bilirubin Total: <0.2 mg/dL (ref 0.0–1.2)
CALCIUM: 9.9 mg/dL (ref 8.7–10.2)
CO2: 22 mmol/L (ref 18–29)
CREATININE: 0.7 mg/dL — AB (ref 0.76–1.27)
Chloride: 100 mmol/L (ref 96–106)
GFR calc Af Amer: 121 mL/min/{1.73_m2} (ref >59)
GFR, EST NON AFRICAN AMERICAN: 105 mL/min/{1.73_m2} (ref >59)
GLOBULIN, TOTAL: 2.8 g/dL (ref 1.5–4.5)
GLUCOSE: 135 mg/dL — AB (ref 65–99)
Potassium: 3.9 mmol/L (ref 3.5–5.2)
SODIUM: 140 mmol/L (ref 134–144)
Total Protein: 7.3 g/dL (ref 6.0–8.5)

## 2016-12-10 LAB — HEMOGLOBIN A1C
ESTIMATED AVERAGE GLUCOSE: 111 mg/dL
Hgb A1c MFr Bld: 5.5 % (ref 4.8–5.6)

## 2016-12-10 LAB — LIPID PANEL
CHOLESTEROL TOTAL: 224 mg/dL — AB (ref 100–199)
Chol/HDL Ratio: 9 ratio — ABNORMAL HIGH (ref 0.0–5.0)
HDL: 25 mg/dL — ABNORMAL LOW (ref >39)
Triglycerides: 511 mg/dL — ABNORMAL HIGH (ref 0–149)

## 2017-04-08 ENCOUNTER — Other Ambulatory Visit: Payer: Self-pay | Admitting: Emergency Medicine

## 2017-04-08 DIAGNOSIS — I10 Essential (primary) hypertension: Secondary | ICD-10-CM

## 2017-04-14 NOTE — Telephone Encounter (Signed)
mychart message sent to pt about making an apt for more refills °

## 2017-05-19 DIAGNOSIS — M170 Bilateral primary osteoarthritis of knee: Secondary | ICD-10-CM | POA: Diagnosis not present

## 2017-05-25 ENCOUNTER — Ambulatory Visit: Payer: BLUE CROSS/BLUE SHIELD | Admitting: Emergency Medicine

## 2017-05-25 ENCOUNTER — Telehealth: Payer: Self-pay | Admitting: *Deleted

## 2017-05-25 NOTE — Telephone Encounter (Signed)
Called patient to let him know he needs an office visit for pre-op clearance. Left message in cell phone voice mail.

## 2017-06-01 ENCOUNTER — Ambulatory Visit (INDEPENDENT_AMBULATORY_CARE_PROVIDER_SITE_OTHER): Payer: BLUE CROSS/BLUE SHIELD | Admitting: Emergency Medicine

## 2017-06-01 ENCOUNTER — Encounter: Payer: Self-pay | Admitting: Emergency Medicine

## 2017-06-01 VITALS — BP 132/80 | HR 81 | Temp 97.8°F | Resp 17 | Ht 68.5 in | Wt 213.0 lb

## 2017-06-01 DIAGNOSIS — Z01818 Encounter for other preprocedural examination: Secondary | ICD-10-CM | POA: Diagnosis not present

## 2017-06-01 DIAGNOSIS — Z23 Encounter for immunization: Secondary | ICD-10-CM

## 2017-06-01 DIAGNOSIS — I10 Essential (primary) hypertension: Secondary | ICD-10-CM

## 2017-06-01 DIAGNOSIS — E785 Hyperlipidemia, unspecified: Secondary | ICD-10-CM | POA: Diagnosis not present

## 2017-06-01 LAB — COMPREHENSIVE METABOLIC PANEL
ALK PHOS: 69 IU/L (ref 39–117)
ALT: 44 IU/L (ref 0–44)
AST: 28 IU/L (ref 0–40)
Albumin/Globulin Ratio: 1.7 (ref 1.2–2.2)
Albumin: 4.7 g/dL (ref 3.5–5.5)
BILIRUBIN TOTAL: 0.3 mg/dL (ref 0.0–1.2)
BUN/Creatinine Ratio: 11 (ref 9–20)
BUN: 10 mg/dL (ref 6–24)
CHLORIDE: 106 mmol/L (ref 96–106)
CO2: 20 mmol/L (ref 20–29)
Calcium: 10 mg/dL (ref 8.7–10.2)
Creatinine, Ser: 0.94 mg/dL (ref 0.76–1.27)
GFR calc Af Amer: 104 mL/min/{1.73_m2} (ref >59)
GFR calc non Af Amer: 90 mL/min/{1.73_m2} (ref >59)
GLOBULIN, TOTAL: 2.8 g/dL (ref 1.5–4.5)
Glucose: 96 mg/dL (ref 65–99)
POTASSIUM: 4.1 mmol/L (ref 3.5–5.2)
SODIUM: 141 mmol/L (ref 134–144)
Total Protein: 7.5 g/dL (ref 6.0–8.5)

## 2017-06-01 LAB — CBC WITH DIFFERENTIAL/PLATELET
BASOS: 2 %
Basophils Absolute: 0.1 10*3/uL (ref 0.0–0.2)
EOS (ABSOLUTE): 0.2 10*3/uL (ref 0.0–0.4)
Eos: 3 %
Hematocrit: 42.8 % (ref 37.5–51.0)
Hemoglobin: 14.5 g/dL (ref 13.0–17.7)
IMMATURE GRANULOCYTES: 0 %
Immature Grans (Abs): 0 10*3/uL (ref 0.0–0.1)
LYMPHS ABS: 1.7 10*3/uL (ref 0.7–3.1)
Lymphs: 30 %
MCH: 30.7 pg (ref 26.6–33.0)
MCHC: 33.9 g/dL (ref 31.5–35.7)
MCV: 91 fL (ref 79–97)
MONOS ABS: 0.5 10*3/uL (ref 0.1–0.9)
Monocytes: 9 %
NEUTROS ABS: 3.2 10*3/uL (ref 1.4–7.0)
NEUTROS PCT: 56 %
PLATELETS: 305 10*3/uL (ref 150–379)
RBC: 4.72 x10E6/uL (ref 4.14–5.80)
RDW: 13.8 % (ref 12.3–15.4)
WBC: 5.7 10*3/uL (ref 3.4–10.8)

## 2017-06-01 LAB — HEMOGLOBIN A1C
ESTIMATED AVERAGE GLUCOSE: 117 mg/dL
Hgb A1c MFr Bld: 5.7 % — ABNORMAL HIGH (ref 4.8–5.6)

## 2017-06-01 LAB — LIPID PANEL
CHOLESTEROL TOTAL: 166 mg/dL (ref 100–199)
Chol/HDL Ratio: 4.6 ratio (ref 0.0–5.0)
HDL: 36 mg/dL — ABNORMAL LOW (ref >39)
LDL CALC: 86 mg/dL (ref 0–99)
Triglycerides: 219 mg/dL — ABNORMAL HIGH (ref 0–149)
VLDL Cholesterol Cal: 44 mg/dL — ABNORMAL HIGH (ref 5–40)

## 2017-06-01 MED ORDER — PRAVASTATIN SODIUM 40 MG PO TABS: 40.0000 mg | ORAL_TABLET | Freq: Every day | ORAL | 3 refills | Status: DC

## 2017-06-01 MED ORDER — AMLODIPINE BESYLATE 10 MG PO TABS: 10.0000 mg | ORAL_TABLET | Freq: Every day | ORAL | 3 refills | Status: DC

## 2017-06-01 NOTE — Patient Instructions (Addendum)
     IF you received an x-ray today, you will receive an invoice from [facility]. Please contact [facility] at [phone] with questions or concerns regarding your invoice.   IF you received labwork today, you will receive an invoice from [facility]. Please contact [facility] at [phone] with questions or concerns regarding your invoice.   Our billing staff will not be able to assist you with questions regarding bills from these companies.  You will be contacted with the lab results as soon as they are available. The fastest way to get your results is to activate your My Chart account. Instructions are located on the last page of this paperwork. If you have not heard from us regarding the results in 2 weeks, please contact this office.      Place preoperative clearance patient instructions here.  

## 2017-06-01 NOTE — Progress Notes (Signed)
Subjective:    Terry Guerra is a 56 y.o. male who presents to the office today for a preoperative consultation at the request of surgeon Renaye Rakers who plans on performing left partial knee replacement on November 6. Planned anesthesia: general. The patient has the following known anesthesia issues: none. Patients bleeding risk: no recent abnormal bleeding and no remote history of abnormal bleeding. Patient does not have objections to receiving blood products if needed.  The following portions of the patient's history were reviewed and updated as appropriate: allergies, current medications, past family history, past medical history, past social history, past surgical history and problem list.  Review of Systems A comprehensive review of systems was negative.    Objective:    BP 132/80   Pulse 81   Temp 97.8 F (36.6 C) (Oral)   Resp 17   Ht 5' 8.5" (1.74 m)   Wt 213 lb (96.6 kg)   SpO2 98%   BMI 31.92 kg/m   General Appearance:    Alert, cooperative, no distress, appears stated age  Head:    Normocephalic, without obvious abnormality, atraumatic  Eyes:    PERRL, conjunctiva/corneas clear, EOM's intact, fundi    benign, both eyes       Ears:    Normal TM's and external ear canals, both ears  Nose:   Nares normal, septum midline, mucosa normal, no drainage    or sinus tenderness  Throat:   Lips, mucosa, and tongue normal; teeth and gums normal  Neck:   Supple, symmetrical, trachea midline, no adenopathy;       thyroid:  No enlargement/tenderness/nodules; no carotid   bruit or JVD  Back:     Symmetric, no curvature, ROM normal, no CVA tenderness  Lungs:     Clear to auscultation bilaterally, respirations unlabored  Chest wall:    No tenderness or deformity  Heart:    Regular rate and rhythm, S1 and S2 normal, no murmur, rub   or gallop  Abdomen:     Soft, non-tender, bowel sounds active all four quadrants,    no masses, no organomegaly        Extremities:   Extremities normal,  atraumatic, no cyanosis or edema  Pulses:   2+ and symmetric all extremities  Skin:   Skin color, texture, turgor normal, no rashes or lesions  Lymph nodes:   Cervical, supraclavicular, and axillary nodes normal  Neurologic:   CNII-XII intact. Normal strength, sensation and reflexes      throughout    Predictors of intubation difficulty:  Morbid obesity? no  Anatomically abnormal facies? no  Prominent incisors? no  Receding mandible? no  Short, thick neck? no  Neck range of motion: normal     Cardiographics ECG: normal sinus rhythm, no blocks or conduction defects, no ischemic changes Echocardiogram: not done  Imaging Chest x-ray: normal   Lab Review  Results for orders placed or performed in visit on 06/01/17 (from the past 48 hour(s))  CBC with Differential/Platelet     Status: None   Collection Time: 06/01/17  9:57 AM  Result Value Ref Range   WBC 5.7 3.4 - 10.8 x10E3/uL   RBC 4.72 4.14 - 5.80 x10E6/uL   Hemoglobin 14.5 13.0 - 17.7 g/dL   Hematocrit 16.1 09.6 - 51.0 %   MCV 91 79 - 97 fL   MCH 30.7 26.6 - 33.0 pg   MCHC 33.9 31.5 - 35.7 g/dL   RDW 04.5 40.9 - 81.1 %   Platelets  305 150 - 379 x10E3/uL   Neutrophils 56 Not Estab. %   Lymphs 30 Not Estab. %   Monocytes 9 Not Estab. %   Eos 3 Not Estab. %   Basos 2 Not Estab. %   Neutrophils Absolute 3.2 1.4 - 7.0 x10E3/uL   Lymphocytes Absolute 1.7 0.7 - 3.1 x10E3/uL   Monocytes Absolute 0.5 0.1 - 0.9 x10E3/uL   EOS (ABSOLUTE) 0.2 0.0 - 0.4 x10E3/uL   Basophils Absolute 0.1 0.0 - 0.2 x10E3/uL   Immature Granulocytes 0 Not Estab. %   Immature Grans (Abs) 0.0 0.0 - 0.1 x10E3/uL  Comprehensive metabolic panel     Status: None   Collection Time: 06/01/17  9:57 AM  Result Value Ref Range   Glucose 96 65 - 99 mg/dL   BUN 10 6 - 24 mg/dL   Creatinine, Ser 4.69 0.76 - 1.27 mg/dL   GFR calc non Af Amer 90 >59 mL/min/1.73   GFR calc Af Amer 104 >59 mL/min/1.73   BUN/Creatinine Ratio 11 9 - 20   Sodium 141 134 - 144  mmol/L   Potassium 4.1 3.5 - 5.2 mmol/L   Chloride 106 96 - 106 mmol/L   CO2 20 20 - 29 mmol/L   Calcium 10.0 8.7 - 10.2 mg/dL   Total Protein 7.5 6.0 - 8.5 g/dL   Albumin 4.7 3.5 - 5.5 g/dL   Globulin, Total 2.8 1.5 - 4.5 g/dL   Albumin/Globulin Ratio 1.7 1.2 - 2.2   Bilirubin Total 0.3 0.0 - 1.2 mg/dL   Alkaline Phosphatase 69 39 - 117 IU/L   AST 28 0 - 40 IU/L   ALT 44 0 - 44 IU/L  Hemoglobin A1c     Status: Abnormal   Collection Time: 06/01/17  9:57 AM  Result Value Ref Range   Hgb A1c MFr Bld 5.7 (H) 4.8 - 5.6 %    Comment:          Prediabetes: 5.7 - 6.4          Diabetes: >6.4          Glycemic control for adults with diabetes: <7.0    Est. average glucose Bld gHb Est-mCnc 117 mg/dL  Lipid panel     Status: Abnormal   Collection Time: 06/01/17  9:57 AM  Result Value Ref Range   Cholesterol, Total 166 100 - 199 mg/dL   Triglycerides 629 (H) 0 - 149 mg/dL   HDL 36 (L) >52 mg/dL    Comment: **Effective June 14, 2017, HDL Cholesterol**   reference interval will be changing to:                                   Male        Male                               40 - [phone]   108 - [phone]    VLDL Cholesterol Cal 44 (H) 5 - 40 mg/dL   LDL Calculated 86 0 - 99 mg/dL   Chol/HDL Ratio 4.6 0.0 - 5.0 ratio    Comment:                                   T. Chol/HDL Ratio  Men  Women                               1/2 Avg.Risk  3.4    3.3                                   Avg.Risk  5.0    4.4                                2X Avg.Risk  9.6    7.1                                3X Avg.Risk 23.4   11.0       Assessment:      56 y.o. male with planned surgery as above.   Known risk factors for perioperative complications: None   Difficulty with intubation is not anticipated.  Cardiac Risk Estimation: low Current medications which may produce withdrawal symptoms if withheld perioperatively: none   The 10-year ASCVD risk score Denman George  DC Jr., et al., 2013) is: 8.8%   Values used to calculate the score:     Age: 23 years     Sex: Male     Is Non-Hispanic African American: No     Diabetic: No     Tobacco smoker: No     Systolic Blood Pressure: 132 mmHg     Is BP treated: Yes     HDL Cholesterol: 36 mg/dL     Total Cholesterol: 166 mg/dL   Plan:    1. Preoperative workup done. 2. Change in medication regimen before surgery: none, continue medication regimen including morning of surgery, with sip of water. 3. Prophylaxis for cardiac events with perioperative beta-blockers: not indicated. 4. Invasive hemodynamic monitoring perioperatively: at the discretion of anesthesiologist. 5. Deep vein thrombosis prophylaxis postoperatively:regimen to be chosen by surgical team. Clear for surgery.

## 2017-06-02 ENCOUNTER — Telehealth: Payer: Self-pay | Admitting: *Deleted

## 2017-06-02 NOTE — Telephone Encounter (Signed)
Faxed completed surgical clearance  ATTN: Tresa Endo. Confirmation page received at 12:26 pm

## 2017-06-02 NOTE — Telephone Encounter (Signed)
Called patient to ask if he wanted the original pre-operative clearance after I faxed it to [facility]. He stated not really, so I put it the scan box.

## 2017-06-18 ENCOUNTER — Inpatient Hospital Stay (HOSPITAL_COMMUNITY): Admission: RE | Admit: 2017-06-18 | Payer: Self-pay | Source: Ambulatory Visit

## 2017-08-09 DIAGNOSIS — G4733 Obstructive sleep apnea (adult) (pediatric): Secondary | ICD-10-CM | POA: Diagnosis present

## 2017-08-09 DIAGNOSIS — M1712 Unilateral primary osteoarthritis, left knee: Secondary | ICD-10-CM | POA: Diagnosis present

## 2017-08-09 NOTE — H&P (Signed)
PREOPERATIVE H&P Patient ID: Terry Guerra MRN: [id number] DOB/AGE: 57/08/1960 57 y.o.  Chief Complaint: OA LEFT KNEE  Planned Procedure Date: 09/07/2017 Medical and cardiac clearance by Dr. Shelly FlattenSaardia     HPI: Terry Guerra is a 56 y.o. male with a history of hypertension and OSA-never fitted for CPAP who presents for evaluation of OA LEFT KNEE. The patient has a history of pain and functional disability both knees due to Anterior medial osteoarthritis and has failed non-surgical conservative treatments for greater than 12 weeks to include NSAID's and/or analgesics, corticosteriod injections, viscosupplementation injections and activity modification.  Onset of symptoms was gradual, starting 3 years ago with gradually worsening course since that time.  Patient currently rates pain at 8 out of 10 with activity. Patient has night pain, worsening of pain with activity and weight bearing and pain that interferes with activities of daily living.  Patient has evidence of periarticular osteophytes and joint space narrowing by imaging studies.  There is no active infection.  Past Medical History:  Diagnosis Date  . Hypertension    Past Surgical History:  Procedure Laterality Date  . HERNIA REPAIR    . NASAL SEPTUM SURGERY     No Known Allergies   Prior to Admission medications   Medication Sig Start Date End Date Taking? Authorizing Provider  amLODipine (NORVASC) 10 MG tablet Take 1 tablet (10 mg total) by mouth daily. 06/01/17 08/30/17  Georgina QuintSagardia, Miguel Jose, MD  DiphenhydrAMINE HCl, Sleep, ([facility] SLEEP AID PO) Take by mouth.    [provider]  naproxen sodium (ANAPROX) 220 MG tablet Take 220 mg by mouth 2 (two) times daily with a meal.    [provider]  pravastatin (PRAVACHOL) 40 MG tablet Take 1 tablet (40 mg total) by mouth daily. 06/01/17   Georgina QuintSagardia, Miguel Jose, MD   Social history: Divorced Music therapistcarpenter.  Former smoker.  2 packs/day x 15 years.  Quit 18 years ago.  6-8 beers  2-3 times per week.  Family History  Problem Relation Age of Onset  . Cancer Mother   . Diabetes Father   . Allergies Brother     ROS: Currently denies lightheadedness, dizziness, Fever, chills, CP, SOB.   No personal history of DVT, PE, MI, or CVA. No loose teeth or dentures All other systems have been reviewed and were otherwise currently negative with the exception of those mentioned in the HPI and as above.  Objective: Vitals: Ht: 5 feet 8-1/2 inches wt: 213 temp: 97.3 BP: 147/99 pulse: 87 O2 95 % on room air. Physical Exam: General: Alert, NAD.  Antalgic Gait  HEENT: EOMI, Good Neck Extension  Pulm: No increased work of breathing.  Clear B/L A/P w/o crackle or wheeze.  CV: RRR, No m/g/r appreciated  GI: soft, NT, ND Neuro: Neuro without gross focal deficit.  Sensation intact distally Skin: No lesions in the area of chief complaint MSK/Surgical Site: Bilateral knees w/o redness or effusion. + Medial JLT. ROM 2-95 degrees.  5/5 strength in extension and flexion.  +EHL/FHL.  NVI.  Stable varus and valgus stress.  Correctable varus deformity.  Imaging Review Plain radiographs demonstrate severe anterior medial degenerative joint disease of both knees.  Preserved lateral space and adequate medial opening on stress views.  Assessment: OA LEFT KNEE Principal Problem:   Unilateral primary osteoarthritis, left knee Active Problems:   HTN (hypertension)   Dyslipidemia   OSA (obstructive sleep apnea)   Plan: Plan for Procedure(s): UNICOMPARTMENTAL KNEE  The patient history, physical  exam, clinical judgement of the provider and imaging are consistent with end stage degenerative joint disease and unicompartmental joint arthroplasty is deemed medically necessary. The treatment options including medical management, injection therapy, and arthroplasty were discussed at length. The risks and benefits of Procedure(s): UNICOMPARTMENTAL KNEE were presented and reviewed.  The risks of  nonoperative treatment, versus surgical intervention including but not limited to continued pain, aseptic loosening, stiffness, dislocation/subluxation, infection, bleeding, nerve injury, blood clots, cardiopulmonary complications, morbidity, mortality, among others were discussed. The patient verbalizes understanding and wishes to proceed with the plan.  Patient is being admitted for inpatient treatment for surgery, pain control, PT, OT, prophylactic antibiotics, VTE prophylaxis, progressive ambulation, ADL's and discharge planning.   Dental prophylaxis discussed and recommended for 2 years postoperatively.   The patient does meet the criteria for TXA which will be used perioperatively via IV.    ASA 325 mg  will be used postoperatively for DVT prophylaxis in addition to SCDs, and early ambulation.  The patient is planning to be discharged home with outpatient physical therapy and care of his mother, daughter, and girlfriend.   Severity of Illness: The appropriate patient status for this patient is OBSERVATION. Observation status is judged to be reasonable and necessary in order to provide the required intensity of service to ensure the patient's safety. The patient's presenting symptoms, physical exam findings, and initial radiographic and laboratory data in the context of their medical condition is felt to place them at decreased risk for further clinical deterioration. Furthermore, it is anticipated that the patient will be medically stable for discharge from the hospital within 2 midnights of admission. The following factors support the patient status of observation.    Terry BilletHenry Calvin Martensen III, PA-C 08/09/2017 6:11 PM

## 2017-08-25 NOTE — Pre-Procedure Instructions (Signed)
Terry JollyJoseph S Guerra  08/25/2017      [id number] Ginette Otto- [place], [facility]ax - [address] [facility] [address] [place] [address] Phone: [phone] Fax: [phone]  [phone] [place] [facility] [place], [place] - [id number] [facility] [id number] [place] [address] Phone: [phone] Fax: [phone]    Your procedure is scheduled on January 15  [facility] Ad[facility] at [phone] A.M.  Call this number if you have problems the morning of surgery:  [phone]   Remember:  Do not eat food or drink liquids after midnight.  Continue all medications as directed by your physician except follow these medication instructions before surgery below   Take these medicines the morning of surgery with A SIP OF WATER  acetaminophen (TYLENOL) amLODipine (NORVASC)    7 days prior to surgery STOP taking any Aspirin(unless otherwise instructed by your surgeon), Aleve, Naproxen, Ibuprofen, Motrin, Advil, Goody's, BC's, all herbal medications, fish oil, and all vitamins DUEXIS     Do not wear jewelry  Do not wear lotions, powders, or cologne, or deodorant.  Men may shave face and neck.  Do not bring valuables to the hospital.  [facility] any belongings or valuables.  Contacts, dentures or bridgework may not be worn into surgery.  Leave your suitcase in the car.  After surgery it may be brought to your room.  For patients admitted to the hospital, discharge time will be determined by your treatment team.  Patients discharged the day of surgery will not be allowed to drive home.    Special instructions:   [place]- Preparing For Surgery  Before surgery, you can play an important role. Because skin is not sterile, your skin needs to be as free of germs as possible. You can reduce the number of germs on your skin by washing with CHG (chlorahexidine gluconate) Soap before surgery.  CHG is an antiseptic  cleaner which kills germs and bonds with the skin to continue killing germs even after washing.  Please do not use if you have an allergy to CHG or antibacterial soaps. If your skin becomes reddened/irritated stop using the CHG.  Do not shave (including legs and underarms) for at least 48 hours prior to first CHG shower. It is OK to shave your face.  Please follow these instructions carefully.   1. Shower the NIGHT BEFORE SURGERY and the MORNING OF SURGERY with CHG.   2. If you chose to wash your hair, wash your hair first as usual with your normal shampoo.  3. After you shampoo, rinse your hair and body thoroughly to remove the shampoo.  4. Use CHG as you would any other liquid soap. You can apply CHG directly to the skin and wash gently with a scrungie or a clean washcloth.   5. Apply the CHG Soap to your body ONLY FROM THE NECK DOWN.  Do not use on open wounds or open sores. Avoid contact with your eyes, ears, mouth and genitals (private parts). Wash Face and genitals (private parts)  with your normal soap.  6. Wash thoroughly, paying special attention to the area where your surgery will be performed.  7. Thoroughly rinse your body with warm water from the neck down.  8. DO NOT shower/wash with your normal soap after using and rinsing off the CHG Soap.  9. Pat yourself dry with a CLEAN TOWEL.  10. Wear CLEAN PAJAMAS to bed  the night before surgery, wear comfortable clothes the morning of surgery  11. Place CLEAN SHEETS on your bed the night of your first shower and DO NOT SLEEP WITH PETS.    Day of Surgery: Do not apply any deodorants/lotions. Please wear clean clothes to the hospital/surgery center.      Please read over the following fact sheets that you were given.

## 2017-08-26 ENCOUNTER — Encounter (HOSPITAL_COMMUNITY): Payer: Self-pay

## 2017-08-26 ENCOUNTER — Encounter (HOSPITAL_COMMUNITY)
Admission: RE | Admit: 2017-08-26 | Discharge: 2017-08-26 | Disposition: A | Discharge status: Home / Self Care | Payer: BLUE CROSS/BLUE SHIELD | Source: Ambulatory Visit | Attending: Orthopedic Surgery | Admitting: Orthopedic Surgery

## 2017-08-26 ENCOUNTER — Other Ambulatory Visit: Payer: Self-pay

## 2017-08-26 DIAGNOSIS — Z79899 Other long term (current) drug therapy: Secondary | ICD-10-CM | POA: Insufficient documentation

## 2017-08-26 DIAGNOSIS — Z833 Family history of diabetes mellitus: Secondary | ICD-10-CM | POA: Diagnosis not present

## 2017-08-26 DIAGNOSIS — G4733 Obstructive sleep apnea (adult) (pediatric): Secondary | ICD-10-CM | POA: Diagnosis not present

## 2017-08-26 DIAGNOSIS — M1712 Unilateral primary osteoarthritis, left knee: Secondary | ICD-10-CM | POA: Diagnosis not present

## 2017-08-26 DIAGNOSIS — I10 Essential (primary) hypertension: Secondary | ICD-10-CM | POA: Insufficient documentation

## 2017-08-26 DIAGNOSIS — Z01812 Encounter for preprocedural laboratory examination: Secondary | ICD-10-CM | POA: Diagnosis not present

## 2017-08-26 DIAGNOSIS — E785 Hyperlipidemia, unspecified: Secondary | ICD-10-CM | POA: Diagnosis not present

## 2017-08-26 HISTORY — DX: Unspecified osteoarthritis, unspecified site: M19.90

## 2017-08-26 HISTORY — DX: Sleep apnea, unspecified: G47.30

## 2017-08-26 LAB — BASIC METABOLIC PANEL
ANION GAP: 10 (ref 5–15)
BUN: 13 mg/dL (ref 6–20)
CHLORIDE: 105 mmol/L (ref 101–111)
CO2: 22 mmol/L (ref 22–32)
Calcium: 9.9 mg/dL (ref 8.9–10.3)
Creatinine, Ser: 0.85 mg/dL (ref 0.61–1.24)
GFR calc Af Amer: >60 mL/min (ref >60)
GFR calc non Af Amer: >60 mL/min (ref >60)
Glucose, Bld: 111 mg/dL — ABNORMAL HIGH (ref 65–99)
POTASSIUM: 3.7 mmol/L (ref 3.5–5.1)
Sodium: 137 mmol/L (ref 135–145)

## 2017-08-26 LAB — CBC
HEMATOCRIT: 42.8 % (ref 39.0–52.0)
HEMOGLOBIN: 15 g/dL (ref 13.0–17.0)
MCH: 31.2 pg (ref 26.0–34.0)
MCHC: 35 g/dL (ref 30.0–36.0)
MCV: 89 fL (ref 78.0–100.0)
Platelets: 273 10*3/uL (ref 150–400)
RBC: 4.81 MIL/uL (ref 4.22–5.81)
RDW: 12.7 % (ref 11.5–15.5)
WBC: 5.1 10*3/uL (ref 4.0–10.5)

## 2017-08-26 LAB — SURGICAL PCR SCREEN
MRSA, PCR: NEGATIVE
STAPHYLOCOCCUS AUREUS: POSITIVE — AB

## 2017-08-26 NOTE — Progress Notes (Deleted)
PCP - James Cardiologist -Stephen Davis - records requested  Chest x-ray - not needed  EKG - 08/26/17 Stress Test - not sure  ECHO - not sure Cardiac Cath - not sure   Sleep Study - 10 years ago CPAP - CPAP suggested but never got   Aspirin Instructions: last dose 08/24/17  Anesthesia review: YES, possible need for Cardiac Clearance?  Patient denies shortness of breath, fever, cough and chest pain at PAT appointment   Patient verbalized understanding of instructions that were given to them at the PAT appointment. Patient was also instructed that they will need to review over the PAT instructions again at home before surgery. 

## 2017-08-26 NOTE — Progress Notes (Signed)
PCP - denies Cardiologist -denies   Chest x-ray - not needed EKG - 06/01/17 Stress Test - denies ECHO - denies Cardiac Cath - denies    Anesthesia review: NO  Patient denies shortness of breath, fever, cough and chest pain at PAT appointment   Patient verbalized understanding of instructions that were given to them at the PAT appointment. Patient was also instructed that they will need to review over the PAT instructions again at home before surgery.

## 2017-08-26 NOTE — Progress Notes (Signed)
Prescription called in, patient notified of PCR results

## 2017-09-06 MED ORDER — LACTATED RINGERS IV SOLN: INTRAVENOUS | Status: DC

## 2017-09-06 MED ORDER — ACETAMINOPHEN 500 MG PO TABS
1000.0000 mg | ORAL_TABLET | Freq: Once | ORAL | Status: AC
  Administered 2017-09-07: 1000 mg via ORAL
  Filled 2017-09-06: qty 2

## 2017-09-06 MED ORDER — CEFAZOLIN SODIUM-DEXTROSE 2-4 GM/100ML-% IV SOLN
2.0000 g | INTRAVENOUS | Status: AC
Start: 2017-09-07 — End: 2017-09-07
  Administered 2017-09-07: 2 g via INTRAVENOUS
  Filled 2017-09-06: qty 100

## 2017-09-06 MED ORDER — GABAPENTIN 300 MG PO CAPS
300.0000 mg | ORAL_CAPSULE | Freq: Once | ORAL | Status: AC
  Administered 2017-09-07: 300 mg via ORAL
  Filled 2017-09-06: qty 1

## 2017-09-06 MED ORDER — TRANEXAMIC ACID 1000 MG/10ML IV SOLN
1000.0000 mg | INTRAVENOUS | Status: AC
  Administered 2017-09-07: 1000 mg via INTRAVENOUS
  Filled 2017-09-06: qty 1100

## 2017-09-07 ENCOUNTER — Encounter (HOSPITAL_COMMUNITY): Payer: Self-pay

## 2017-09-07 ENCOUNTER — Observation Stay (HOSPITAL_COMMUNITY)
Admission: RE | Admit: 2017-09-07 | Discharge: 2017-09-08 | Disposition: A | Discharge status: Home / Self Care | Payer: BLUE CROSS/BLUE SHIELD | Source: Ambulatory Visit | Attending: Orthopedic Surgery | Admitting: Orthopedic Surgery

## 2017-09-07 ENCOUNTER — Encounter (HOSPITAL_COMMUNITY)
Admission: RE | Disposition: A | Discharge status: Home / Self Care | Payer: Self-pay | Source: Ambulatory Visit | Attending: Orthopedic Surgery

## 2017-09-07 ENCOUNTER — Ambulatory Visit (HOSPITAL_COMMUNITY): Payer: BLUE CROSS/BLUE SHIELD

## 2017-09-07 ENCOUNTER — Ambulatory Visit (HOSPITAL_COMMUNITY): Payer: BLUE CROSS/BLUE SHIELD | Admitting: Certified Registered Nurse Anesthetist

## 2017-09-07 DIAGNOSIS — Z96652 Presence of left artificial knee joint: Secondary | ICD-10-CM | POA: Diagnosis not present

## 2017-09-07 DIAGNOSIS — Z96659 Presence of unspecified artificial knee joint: Secondary | ICD-10-CM

## 2017-09-07 DIAGNOSIS — I10 Essential (primary) hypertension: Secondary | ICD-10-CM | POA: Diagnosis not present

## 2017-09-07 DIAGNOSIS — G4733 Obstructive sleep apnea (adult) (pediatric): Secondary | ICD-10-CM | POA: Diagnosis present

## 2017-09-07 DIAGNOSIS — M1712 Unilateral primary osteoarthritis, left knee: Secondary | ICD-10-CM | POA: Diagnosis not present

## 2017-09-07 DIAGNOSIS — M1711 Unilateral primary osteoarthritis, right knee: Secondary | ICD-10-CM | POA: Diagnosis not present

## 2017-09-07 DIAGNOSIS — M1710 Unilateral primary osteoarthritis, unspecified knee: Secondary | ICD-10-CM | POA: Diagnosis present

## 2017-09-07 DIAGNOSIS — Z87891 Personal history of nicotine dependence: Secondary | ICD-10-CM | POA: Insufficient documentation

## 2017-09-07 DIAGNOSIS — Z79899 Other long term (current) drug therapy: Secondary | ICD-10-CM | POA: Insufficient documentation

## 2017-09-07 DIAGNOSIS — Z471 Aftercare following joint replacement surgery: Secondary | ICD-10-CM | POA: Diagnosis not present

## 2017-09-07 DIAGNOSIS — G8918 Other acute postprocedural pain: Secondary | ICD-10-CM | POA: Diagnosis not present

## 2017-09-07 DIAGNOSIS — E785 Hyperlipidemia, unspecified: Secondary | ICD-10-CM | POA: Diagnosis present

## 2017-09-07 HISTORY — PX: PARTIAL KNEE ARTHROPLASTY: SHX2174

## 2017-09-07 SURGERY — ARTHROPLASTY, KNEE, UNICOMPARTMENTAL
Anesthesia: Spinal | Laterality: Left

## 2017-09-07 MED ORDER — POLYETHYLENE GLYCOL 3350 17 G PO PACK: 17.0000 g | PACK | Freq: Every day | ORAL | Status: DC | PRN

## 2017-09-07 MED ORDER — PHENOL 1.4 % MT LIQD: 1.0000 | OROMUCOSAL | Status: DC | PRN

## 2017-09-07 MED ORDER — SENNA 8.6 MG PO TABS
1.0000 | ORAL_TABLET | Freq: Two times a day (BID) | ORAL | Status: DC
  Administered 2017-09-07 – 2017-09-08 (×3): 8.6 mg via ORAL
  Filled 2017-09-07 (×3): qty 1

## 2017-09-07 MED ORDER — CEFAZOLIN SODIUM-DEXTROSE 1-4 GM/50ML-% IV SOLN
1.0000 g | Freq: Four times a day (QID) | INTRAVENOUS | Status: AC
  Administered 2017-09-07 (×2): 1 g via INTRAVENOUS
  Filled 2017-09-07 (×2): qty 50

## 2017-09-07 MED ORDER — DEXAMETHASONE SODIUM PHOSPHATE 10 MG/ML IJ SOLN
INTRAMUSCULAR | Status: DC | PRN
  Administered 2017-09-07: 5 mg via INTRAVENOUS

## 2017-09-07 MED ORDER — FENTANYL CITRATE (PF) 100 MCG/2ML IJ SOLN
INTRAMUSCULAR | Status: DC | PRN
  Administered 2017-09-07 (×2): 25 ug via INTRAVENOUS

## 2017-09-07 MED ORDER — DIPHENHYDRAMINE HCL 12.5 MG/5ML PO ELIX: 12.5000 mg | ORAL_SOLUTION | ORAL | Status: DC | PRN

## 2017-09-07 MED ORDER — METOCLOPRAMIDE HCL 5 MG PO TABS: 5.0000 mg | ORAL_TABLET | Freq: Three times a day (TID) | ORAL | Status: DC | PRN

## 2017-09-07 MED ORDER — DEXTROSE 5 % IV SOLN: 500.0000 mg | Freq: Four times a day (QID) | INTRAVENOUS | Status: DC | PRN

## 2017-09-07 MED ORDER — 0.9 % SODIUM CHLORIDE (POUR BTL) OPTIME
TOPICAL | Status: DC | PRN
  Administered 2017-09-07: 1000 mL

## 2017-09-07 MED ORDER — BUPIVACAINE HCL (PF) 0.25 % IJ SOLN
INTRAMUSCULAR | Status: DC | PRN
  Administered 2017-09-07: 30 mL

## 2017-09-07 MED ORDER — OXYCODONE HCL 5 MG PO TABS
10.0000 mg | ORAL_TABLET | ORAL | Status: DC | PRN
  Administered 2017-09-07 – 2017-09-08 (×6): 10 mg via ORAL
  Filled 2017-09-07 (×6): qty 2

## 2017-09-07 MED ORDER — BUPIVACAINE IN DEXTROSE 0.75-8.25 % IT SOLN
INTRATHECAL | Status: DC | PRN
  Administered 2017-09-07: 2 mL via INTRATHECAL

## 2017-09-07 MED ORDER — ONDANSETRON HCL 4 MG PO TABS: 4.0000 mg | ORAL_TABLET | Freq: Four times a day (QID) | ORAL | Status: DC | PRN

## 2017-09-07 MED ORDER — BUPIVACAINE HCL (PF) 0.25 % IJ SOLN
INTRAMUSCULAR | Status: AC
  Filled 2017-09-07: qty 30

## 2017-09-07 MED ORDER — OXYCODONE HCL 5 MG/5ML PO SOLN: 5.0000 mg | Freq: Once | ORAL | Status: DC | PRN

## 2017-09-07 MED ORDER — MIDAZOLAM HCL 2 MG/2ML IJ SOLN
INTRAMUSCULAR | Status: AC
  Filled 2017-09-07: qty 2

## 2017-09-07 MED ORDER — PHENYLEPHRINE HCL 10 MG/ML IJ SOLN
INTRAVENOUS | Status: DC | PRN
  Administered 2017-09-07: 30 ug/min via INTRAVENOUS

## 2017-09-07 MED ORDER — AMLODIPINE BESYLATE 5 MG PO TABS
10.0000 mg | ORAL_TABLET | Freq: Every day | ORAL | Status: DC
  Filled 2017-09-07: qty 2

## 2017-09-07 MED ORDER — ACETAMINOPHEN 500 MG PO TABS: 1000.0000 mg | ORAL_TABLET | Freq: Three times a day (TID) | ORAL | 0 refills | Status: AC

## 2017-09-07 MED ORDER — ACETAMINOPHEN 500 MG PO TABS
1000.0000 mg | ORAL_TABLET | Freq: Three times a day (TID) | ORAL | Status: DC
  Administered 2017-09-07 (×2): 1000 mg via ORAL
  Filled 2017-09-07 (×2): qty 2

## 2017-09-07 MED ORDER — DOCUSATE SODIUM 100 MG PO CAPS: 100.0000 mg | ORAL_CAPSULE | Freq: Two times a day (BID) | ORAL | 0 refills | Status: DC

## 2017-09-07 MED ORDER — DEXAMETHASONE SODIUM PHOSPHATE 10 MG/ML IJ SOLN: 10.0000 mg | Freq: Once | INTRAMUSCULAR | Status: DC

## 2017-09-07 MED ORDER — METHOCARBAMOL 500 MG PO TABS: 500.0000 mg | ORAL_TABLET | Freq: Four times a day (QID) | ORAL | 0 refills | Status: DC | PRN

## 2017-09-07 MED ORDER — HYDROMORPHONE HCL 1 MG/ML IJ SOLN: 0.5000 mg | INTRAMUSCULAR | Status: DC | PRN

## 2017-09-07 MED ORDER — SODIUM CHLORIDE 0.9 % IR SOLN
Status: DC | PRN
  Administered 2017-09-07: 3000 mL

## 2017-09-07 MED ORDER — CELECOXIB 200 MG PO CAPS
200.0000 mg | ORAL_CAPSULE | Freq: Two times a day (BID) | ORAL | Status: DC
  Administered 2017-09-07 – 2017-09-08 (×3): 200 mg via ORAL
  Filled 2017-09-07 (×3): qty 1

## 2017-09-07 MED ORDER — SODIUM CHLORIDE FLUSH 0.9 % IV SOLN
INTRAVENOUS | Status: DC | PRN
  Administered 2017-09-07: 30 mL via INTRAVENOUS

## 2017-09-07 MED ORDER — OXYCODONE HCL 5 MG PO TABS: 5.0000 mg | ORAL_TABLET | ORAL | 0 refills | Status: AC | PRN

## 2017-09-07 MED ORDER — ONDANSETRON HCL 4 MG PO TABS: 4.0000 mg | ORAL_TABLET | Freq: Three times a day (TID) | ORAL | 0 refills | Status: DC | PRN

## 2017-09-07 MED ORDER — KETOROLAC TROMETHAMINE 30 MG/ML IJ SOLN
INTRAMUSCULAR | Status: AC
  Filled 2017-09-07: qty 2

## 2017-09-07 MED ORDER — PHENYLEPHRINE 40 MCG/ML (10ML) SYRINGE FOR IV PUSH (FOR BLOOD PRESSURE SUPPORT)
PREFILLED_SYRINGE | INTRAVENOUS | Status: AC
  Filled 2017-09-07: qty 30

## 2017-09-07 MED ORDER — MENTHOL 3 MG MT LOZG: 1.0000 | LOZENGE | OROMUCOSAL | Status: DC | PRN

## 2017-09-07 MED ORDER — METOCLOPRAMIDE HCL 5 MG/ML IJ SOLN: 5.0000 mg | Freq: Three times a day (TID) | INTRAMUSCULAR | Status: DC | PRN

## 2017-09-07 MED ORDER — ACETAMINOPHEN 650 MG RE SUPP: 650.0000 mg | RECTAL | Status: DC | PRN

## 2017-09-07 MED ORDER — ONDANSETRON HCL 4 MG/2ML IJ SOLN
INTRAMUSCULAR | Status: AC
  Filled 2017-09-07: qty 2

## 2017-09-07 MED ORDER — OXYCODONE HCL 5 MG PO TABS: 5.0000 mg | ORAL_TABLET | Freq: Once | ORAL | Status: DC | PRN

## 2017-09-07 MED ORDER — FENTANYL CITRATE (PF) 100 MCG/2ML IJ SOLN
INTRAMUSCULAR | Status: AC
  Administered 2017-09-07: 50 ug via INTRAVENOUS
  Filled 2017-09-07: qty 2

## 2017-09-07 MED ORDER — PRAVASTATIN SODIUM 40 MG PO TABS
40.0000 mg | ORAL_TABLET | Freq: Every day | ORAL | Status: DC
  Administered 2017-09-07: 40 mg via ORAL
  Filled 2017-09-07: qty 1

## 2017-09-07 MED ORDER — HYDROMORPHONE HCL 1 MG/ML IJ SOLN: 0.2500 mg | INTRAMUSCULAR | Status: DC | PRN

## 2017-09-07 MED ORDER — METHOCARBAMOL 500 MG PO TABS
500.0000 mg | ORAL_TABLET | Freq: Four times a day (QID) | ORAL | Status: DC | PRN
  Administered 2017-09-07 – 2017-09-08 (×3): 500 mg via ORAL
  Filled 2017-09-07 (×3): qty 1

## 2017-09-07 MED ORDER — KETOROLAC TROMETHAMINE 30 MG/ML IJ SOLN
INTRAMUSCULAR | Status: DC | PRN
  Administered 2017-09-07: 30 mg via INTRAVENOUS

## 2017-09-07 MED ORDER — MIDAZOLAM HCL 5 MG/5ML IJ SOLN
INTRAMUSCULAR | Status: DC | PRN
  Administered 2017-09-07: 1 mg via INTRAVENOUS

## 2017-09-07 MED ORDER — DEXAMETHASONE SODIUM PHOSPHATE 10 MG/ML IJ SOLN
INTRAMUSCULAR | Status: AC
  Filled 2017-09-07: qty 1

## 2017-09-07 MED ORDER — MIDAZOLAM HCL 2 MG/2ML IJ SOLN
INTRAMUSCULAR | Status: AC
  Administered 2017-09-07: 1 mg via INTRAVENOUS
  Filled 2017-09-07: qty 2

## 2017-09-07 MED ORDER — SORBITOL 70 % SOLN
30.0000 mL | Freq: Every day | Status: DC | PRN
  Filled 2017-09-07: qty 30

## 2017-09-07 MED ORDER — PROPOFOL 500 MG/50ML IV EMUL
INTRAVENOUS | Status: DC | PRN
  Administered 2017-09-07: 80 ug/kg/min via INTRAVENOUS

## 2017-09-07 MED ORDER — MIDAZOLAM HCL 2 MG/2ML IJ SOLN
1.0000 mg | Freq: Once | INTRAMUSCULAR | Status: AC
  Administered 2017-09-07: 1 mg via INTRAVENOUS

## 2017-09-07 MED ORDER — FENTANYL CITRATE (PF) 250 MCG/5ML IJ SOLN
INTRAMUSCULAR | Status: AC
  Filled 2017-09-07: qty 5

## 2017-09-07 MED ORDER — ONDANSETRON HCL 4 MG/2ML IJ SOLN
INTRAMUSCULAR | Status: DC | PRN
  Administered 2017-09-07: 4 mg via INTRAVENOUS

## 2017-09-07 MED ORDER — FENTANYL CITRATE (PF) 100 MCG/2ML IJ SOLN
50.0000 ug | Freq: Once | INTRAMUSCULAR | Status: AC
  Administered 2017-09-07: 50 ug via INTRAVENOUS

## 2017-09-07 MED ORDER — LACTATED RINGERS IV SOLN
INTRAVENOUS | Status: DC
  Administered 2017-09-07 (×2): via INTRAVENOUS

## 2017-09-07 MED ORDER — PHENYLEPHRINE HCL 10 MG/ML IJ SOLN
INTRAMUSCULAR | Status: DC | PRN
  Administered 2017-09-07: 120 ug via INTRAVENOUS
  Administered 2017-09-07: 80 ug via INTRAVENOUS
  Administered 2017-09-07: 120 ug via INTRAVENOUS
  Administered 2017-09-07: 40 ug via INTRAVENOUS
  Administered 2017-09-07: 120 ug via INTRAVENOUS

## 2017-09-07 MED ORDER — ACETAMINOPHEN 325 MG PO TABS
650.0000 mg | ORAL_TABLET | ORAL | Status: DC | PRN
  Administered 2017-09-08: 650 mg via ORAL
  Filled 2017-09-07: qty 2

## 2017-09-07 MED ORDER — ASPIRIN EC 325 MG PO TBEC
325.0000 mg | DELAYED_RELEASE_TABLET | Freq: Every day | ORAL | Status: DC
  Administered 2017-09-08: 325 mg via ORAL
  Filled 2017-09-07: qty 1

## 2017-09-07 MED ORDER — CHLORHEXIDINE GLUCONATE 4 % EX LIQD: 60.0000 mL | Freq: Once | CUTANEOUS | Status: DC

## 2017-09-07 MED ORDER — ASPIRIN EC 325 MG PO TBEC: 325.0000 mg | DELAYED_RELEASE_TABLET | Freq: Every day | ORAL | 0 refills | Status: DC

## 2017-09-07 MED ORDER — PROPOFOL 10 MG/ML IV BOLUS
INTRAVENOUS | Status: DC | PRN
  Administered 2017-09-07 (×2): 20 mg via INTRAVENOUS

## 2017-09-07 MED ORDER — LACTATED RINGERS IV SOLN
INTRAVENOUS | Status: DC
  Administered 2017-09-07: 15:00:00 via INTRAVENOUS

## 2017-09-07 MED ORDER — DOCUSATE SODIUM 100 MG PO CAPS
100.0000 mg | ORAL_CAPSULE | Freq: Two times a day (BID) | ORAL | Status: DC
  Administered 2017-09-07 – 2017-09-08 (×3): 100 mg via ORAL
  Filled 2017-09-07 (×3): qty 1

## 2017-09-07 MED ORDER — BUPIVACAINE-EPINEPHRINE (PF) 0.5% -1:200000 IJ SOLN
INTRAMUSCULAR | Status: DC | PRN
  Administered 2017-09-07 (×6): 5 mL via PERINEURAL

## 2017-09-07 MED ORDER — ONDANSETRON HCL 4 MG/2ML IJ SOLN: 4.0000 mg | Freq: Four times a day (QID) | INTRAMUSCULAR | Status: DC | PRN

## 2017-09-07 MED ORDER — OXYCODONE HCL 5 MG PO TABS: 5.0000 mg | ORAL_TABLET | ORAL | Status: DC | PRN

## 2017-09-07 SURGICAL SUPPLY — 60 items
BANDAGE ACE 6X5 VEL STRL LF (GAUZE/BANDAGES/DRESSINGS) ×1 IMPLANT
BANDAGE ELASTIC 6 VELCRO ST LF (GAUZE/BANDAGES/DRESSINGS) ×1 IMPLANT
BANDAGE ESMARK 6X9 LF (GAUZE/BANDAGES/DRESSINGS) ×1 IMPLANT
BNDG CMPR 9X6 STRL LF SNTH (GAUZE/BANDAGES/DRESSINGS) ×1
BNDG ESMARK 6X9 LF (GAUZE/BANDAGES/DRESSINGS) ×2
BOWL SMART MIX CTS (DISPOSABLE) ×2 IMPLANT
CAP KNEE PARTIAL 2 IMPLANT
CAPT KNEE PARTIAL 2 ×2 IMPLANT
CEMENT BONE R 1X40 (Cement) ×1 IMPLANT
CHLORAPREP W/TINT 26ML (MISCELLANEOUS) ×2 IMPLANT
CLSR STERI-STRIP ANTIMIC 1/2X4 (GAUZE/BANDAGES/DRESSINGS) ×2 IMPLANT
COVER SURGICAL LIGHT HANDLE (MISCELLANEOUS) ×2 IMPLANT
CUFF TOURNIQUET SINGLE 34IN LL (TOURNIQUET CUFF) ×2 IMPLANT
DRAPE ARTHROSCOPY W/POUCH 114 (DRAPES) ×2 IMPLANT
DRAPE HALF SHEET 40X57 (DRAPES) ×2 IMPLANT
DRAPE IMP U-DRAPE 54X76 (DRAPES) ×4 IMPLANT
DRAPE U-SHAPE 47X51 STRL (DRAPES) ×2 IMPLANT
DRSG MEPILEX BORDER 4X4 (GAUZE/BANDAGES/DRESSINGS) IMPLANT
DRSG MEPILEX BORDER 4X8 (GAUZE/BANDAGES/DRESSINGS) ×3 IMPLANT
ELECT CAUTERY BLADE 6.4 (BLADE) ×2 IMPLANT
ELECT REM PT RETURN 9FT ADLT (ELECTROSURGICAL) ×2
ELECTRODE REM PT RTRN 9FT ADLT (ELECTROSURGICAL) ×1 IMPLANT
EVACUATOR 1/8 PVC DRAIN (DRAIN) IMPLANT
FACESHIELD WRAPAROUND (MASK) ×4 IMPLANT
FACESHIELD WRAPAROUND OR TEAM (MASK) ×2 IMPLANT
GLOVE BIO SURGEON STRL SZ7.5 (GLOVE) ×4 IMPLANT
GLOVE BIOGEL PI IND STRL 8 (GLOVE) ×2 IMPLANT
GLOVE BIOGEL PI INDICATOR 8 (GLOVE) ×2
GOWN STRL REUS W/ TWL LRG LVL3 (GOWN DISPOSABLE) ×2 IMPLANT
GOWN STRL REUS W/ TWL XL LVL3 (GOWN DISPOSABLE) ×1 IMPLANT
GOWN STRL REUS W/TWL LRG LVL3 (GOWN DISPOSABLE) ×4
GOWN STRL REUS W/TWL XL LVL3 (GOWN DISPOSABLE) ×2
HANDPIECE INTERPULSE COAX TIP (DISPOSABLE) ×2
IMMOBILIZER KNEE 22 UNIV (SOFTGOODS) ×2 IMPLANT
KIT BASIN OR (CUSTOM PROCEDURE TRAY) ×2 IMPLANT
KIT ROOM TURNOVER OR (KITS) ×2 IMPLANT
MANIFOLD NEPTUNE II (INSTRUMENTS) ×2 IMPLANT
NEEDLE 22X1 1/2 (OR ONLY) (NEEDLE) ×1 IMPLANT
NS IRRIG 1000ML POUR BTL (IV SOLUTION) ×2 IMPLANT
PACK BLADE SAW RECIP 70 3 PT (BLADE) ×1 IMPLANT
PACK TOTAL JOINT (CUSTOM PROCEDURE TRAY) ×2 IMPLANT
PACK UNIVERSAL I (CUSTOM PROCEDURE TRAY) ×2 IMPLANT
PAD ARMBOARD 7.5X6 YLW CONV (MISCELLANEOUS) ×2 IMPLANT
SET HNDPC FAN SPRY TIP SCT (DISPOSABLE) ×1 IMPLANT
STAPLER VISISTAT 35W (STAPLE) IMPLANT
SUCTION FRAZIER HANDLE 10FR (MISCELLANEOUS) ×1
SUCTION TUBE FRAZIER 10FR DISP (MISCELLANEOUS) ×1 IMPLANT
SUT MNCRL AB 4-0 PS2 18 (SUTURE) IMPLANT
SUT MON AB 2-0 CT1 27 (SUTURE) ×4 IMPLANT
SUT MON AB 2-0 CT1 36 (SUTURE) ×1 IMPLANT
SUT MON AB 4-0 PC3 18 (SUTURE) ×2 IMPLANT
SUT VIC AB 0 CT1 27 (SUTURE) ×2
SUT VIC AB 0 CT1 27XBRD ANBCTR (SUTURE) IMPLANT
SUT VIC AB 1 CT1 27 (SUTURE) ×4
SUT VIC AB 1 CT1 27XBRD ANBCTR (SUTURE) IMPLANT
SUT VIC AB 1 CTX 36 (SUTURE) ×4
SUT VIC AB 1 CTX36XBRD ANBCTR (SUTURE) ×2 IMPLANT
SYRINGE 60CC LL (MISCELLANEOUS) ×1 IMPLANT
TOWEL OR 17X24 6PK STRL BLUE (TOWEL DISPOSABLE) ×2 IMPLANT
TOWEL OR 17X26 10 PK STRL BLUE (TOWEL DISPOSABLE) ×2 IMPLANT

## 2017-09-07 NOTE — Progress Notes (Signed)
Orthopedic Tech Progress Note Patient Details:  Terry Guerra 06/06/1961 [id number]  CPM Left Knee CPM Left Knee: On Left Knee Flexion (Degrees): 90 Left Knee Extension (Degrees): 0 Additional Comments: applied cpm to pt left knee/leg.  pt tolerated application very well.  bone foam provided at bedside.  OHF applied for support.   Post Interventions Patient Tolerated: Well Instructions Provided: Adjustment of device, Care of device  Alvina ChouWilliams, Nickholas Goldston C 09/07/2017, 12:54 PM

## 2017-09-07 NOTE — Discharge Instructions (Signed)

## 2017-09-07 NOTE — Op Note (Signed)
09/07/2017  11:44 AM  PATIENT:  Terry Guerra    PRE-OPERATIVE DIAGNOSIS:  OA LEFT KNEE  POST-OPERATIVE DIAGNOSIS:  Same  PROCEDURE:  UNICOMPARTMENTAL KNEE  SURGEON:  Jamieka Royle, Jewel Baize, MD  PHYSICIAN ASSISTANT: Aquilla Hacker, PA-C, he was present and scrubbed throughout the case, critical for completion in a timely fashion, and for retraction, instrumentation, and closure.   ANESTHESIA:   General  PREOPERATIVE INDICATIONS:  Terry Guerra is a  57 y.o. male with a diagnosis of OA LEFT KNEE who failed conservative measures and elected for surgical management.    The risks benefits and alternatives were discussed with the patient preoperatively including but not limited to the risks of infection, bleeding, nerve injury, cardiopulmonary complications, blood clots, the need for revision surgery, among others, and the patient was willing to proceed.  OPERATIVE IMPLANTS: Biomet Oxford mobile bearing medial compartment arthroplasty. Femoral Component: medium. Tibial tray: C, Size 3 poly.   OPERATIVE FINDINGS: Endstage grade 4 medial compartment osteoarthritis. No significant changes in the lateral or patellofemoral joint  OPERATIVE PROCEDURE: The patient was brought to the operating room placed in supine position. General anesthesia was administered. IV antibiotics were given. The lower extremity was placed in the legholder and prepped and draped in usual sterile fashion.  Time out was performed.  The leg was elevated and exsanguinated and the tourniquet was inflated. Anteromedial incision was performed, and I took care to preserve the MCL. Parapatellar incision was carried out, and the osteophytes were excised, along with the medial meniscus and a small portion of the fat pad.  The extra medullary tibial cutting jig was applied, using the spoon and the 4mm G-Clamp, and I took care to protect the anterior cruciate ligament insertion and the tibial spine. The medial collateral ligament was  also protected, and I resected my proximal tibia, matching the anatomic slope.   The proximal tibial bony cut was removed in one piece, and I turned my attention to the femur.  The intramedullary femoral rod was placed using the drill, and then using the appropriate reference, I assembled the femoral jig, setting my posterior cutting block. I resected my posterior femur, and then measured my gap.   I then used the mill to match the extension gap to the flexion gap. The gaps were then measured again with the appropriate feeler gauges. Once I had balanced flexion and extension gaps, I then completed the preparation of the femur.  I milled off the anterior aspect of the distal femur to prevent impingement. I also exposed the tibia, and selected the above-named component, and then used the cutting jig to prepare the keel slot on the tibia. I also used the awl to curette out the bone to complete the preparation of the keel. The back wall was intact.  I then placed trial components, and it was found to have excellent motion, and appropriate balance.  I then cemented the components into place, cementing the tibia first, removing all excess cement, and then cementing the femur.  All loose cement was removed.  The real polyethylene insert was applied manually, and the knee was taken through functional range of motion, and found to have excellent stability and restoration of joint motion, with excellent balance.  The wounds were irrigated copiously, and the parapatellar tissue closed with Vicryl, followed by Vicryl for the subcutaneous tissue, with routine closure with Steri-Strips and sterile gauze.  The tourniquet was released, and the patient was awakened and extubated and returned to PACU in  stable and satisfactory condition. There were no complications.  POSTOPERATIVE PLAN: DVT px will consist of SCD's and chemical px with Mobilization, WBAT     Terry Apleyimothy D Keitha Kolk, MD

## 2017-09-07 NOTE — Anesthesia Preprocedure Evaluation (Signed)
Anesthesia Evaluation  Patient identified by MRN, date of birth, ID band Patient awake    Reviewed: Allergy & Precautions, NPO status , Patient's Chart, lab work & pertinent test results  History of Anesthesia Complications Negative for: history of anesthetic complications  Airway Mallampati: II  TM Distance: >3 FB Neck ROM: Full    Dental no notable dental hx.    Pulmonary sleep apnea , former smoker,    breath sounds clear to auscultation       Cardiovascular hypertension,  Rhythm:Regular     Neuro/Psych    GI/Hepatic negative GI ROS, Neg liver ROS,   Endo/Other  negative endocrine ROS  Renal/GU negative Renal ROS     Musculoskeletal  (+) Arthritis ,   Abdominal   Peds  Hematology negative hematology ROS (+)   Anesthesia Other Findings   Reproductive/Obstetrics                             Anesthesia Physical Anesthesia Plan  ASA: II  Anesthesia Plan: Spinal   Post-op Pain Management:  Regional for Post-op pain   Induction: Intravenous  PONV Risk Score and Plan: 2 and Dexamethasone, Ondansetron and Treatment may vary due to age or medical condition  Airway Management Planned: Natural Airway  Additional Equipment:   Intra-op Plan:   Post-operative Plan:   Informed Consent: I have reviewed the patients History and Physical, chart, labs and discussed the procedure including the risks, benefits and alternatives for the proposed anesthesia with the patient or authorized representative who has indicated his/her understanding and acceptance.     Plan Discussed with: CRNA  Anesthesia Plan Comments:         Anesthesia Quick Evaluation

## 2017-09-07 NOTE — Anesthesia Procedure Notes (Signed)
Procedure Name: MAC Date/Time: 09/07/2017 10:13 AM Performed by: White, Amedeo Plenty, CRNA Pre-anesthesia Checklist: Patient identified, Emergency Drugs available, Suction available and Patient being monitored Patient Re-evaluated:Patient Re-evaluated prior to induction Oxygen Delivery Method: Simple face mask

## 2017-09-07 NOTE — Anesthesia Procedure Notes (Signed)
Spinal  Patient location during procedure: OR Start time: 09/07/2017 10:15 AM End time: 09/07/2017 10:22 AM Staffing Anesthesiologist: Sharee HolsterMassagee, Kynslie Ringle, MD Performed: anesthesiologist  Preanesthetic Checklist Completed: patient identified, site marked, surgical consent, pre-op evaluation, timeout performed, IV checked, risks and benefits discussed and monitors and equipment checked Spinal Block Patient position: sitting Prep: ChloraPrep Patient monitoring: heart rate, cardiac monitor, continuous pulse ox and blood pressure Approach: midline Location: L3-4 Injection technique: single-shot Needle Needle type: Pencan  Needle gauge: 24 G Needle length: 9 cm Needle insertion depth: 6 cm Assessment Sensory level: T6

## 2017-09-07 NOTE — Interval H&P Note (Signed)
History and Physical Interval Note:  09/07/2017 9:07 AM  Terry Guerra  has presented today for surgery, with the diagnosis of OA LEFT KNEE  The various methods of treatment have been discussed with the patient and family. After consideration of risks, benefits and other options for treatment, the patient has consented to  Procedure(s): UNICOMPARTMENTAL KNEE (Left) as a surgical intervention .  The patient's history has been reviewed, patient examined, no change in status, stable for surgery.  I have reviewed the patient's chart and labs.  Questions were answered to the patient's satisfaction.     MURPHY, TIMOTHY D

## 2017-09-07 NOTE — Evaluation (Signed)
Physical Therapy Evaluation Patient Details Name: Terry Guerra MRN: [id number] DOB: 06-Nov-1960 Today's Date: 09/07/2017   History of Present Illness  Pt is a 57 y/o male s/p L partial knee replacement. PMH includes HTN and OSA.   Clinical Impression  Pt is s/p surgery above with deficits below. PTA, pt was independent with functional mobility. Upon eval, pt presenting with post op pain and weakness, however, tolerated gait training well. Required min guard assist with mobility with RW. Reports daughter will be able to assist at home and will need DME below. Reports he will be getting outpatient PT at d/c. Will continue to follow acutely to maximize functional mobility independence and safety.     Follow Up Recommendations DC plan and follow up therapy as arranged by surgeon;Supervision for mobility/OOB    Equipment Recommendations  Rolling walker with 5" wheels;3in1 (PT)    Recommendations for Other Services       Precautions / Restrictions Precautions Precautions: Knee Precaution Booklet Issued: Yes (comment) Precaution Comments: Reviewed supine ther ex with pt.  Restrictions Weight Bearing Restrictions: No      Mobility  Bed Mobility Overal bed mobility: Needs Assistance Bed Mobility: Supine to Sit     Supine to sit: Supervision     General bed mobility comments: Supervision for safety.   Transfers Overall transfer level: Needs assistance Equipment used: Rolling walker (2 wheeled) Transfers: Sit to/from Stand Sit to Stand: Min guard         General transfer comment: Min guard for steadying assist. Verbal cues for safe hand placement.   Ambulation/Gait Ambulation/Gait assistance: Min guard Ambulation Distance (Feet): 100 Feet Assistive device: Rolling walker (2 wheeled) Gait Pattern/deviations: Step-to pattern;Decreased step length - left;Decreased step length - right;Step-through pattern;Antalgic;Decreased weight shift to left Gait velocity: Decreased  Gait  velocity interpretation: Below normal speed for age/gender General Gait Details: Slow, antalgic gait. Able to progress to step through gait, however, continues to demonstrate decreased weightshift to the L. Verbal cues for sequencing using RW.   Stairs            Wheelchair Mobility    Modified Rankin (Stroke Patients Only)       Balance Overall balance assessment: Needs assistance Sitting-balance support: No upper extremity supported;Feet supported Sitting balance-Leahy Scale: Good     Standing balance support: Bilateral upper extremity supported;No upper extremity supported;During functional activity Standing balance-Leahy Scale: Fair Standing balance comment: Able to maintain static standing without UE support to wash hands at sink.                              Pertinent Vitals/Pain Pain Assessment: 0-10 Pain Score: 6  Pain Location: L knee  Pain Descriptors / Indicators: Aching;Operative site guarding Pain Intervention(s): Monitored during session;Limited activity within patient's tolerance;Repositioned;Patient requesting pain meds-RN notified    Home Living Family/patient expects to be discharged to:: Private residence Living Arrangements: Alone Available Help at Discharge: Family;Available 24 hours/day Type of Home: House Home Access: Stairs to enter Entrance Stairs-Rails: Right;Left;Can reach both Entrance Stairs-Number of Steps: 12; 4 at back with no rail. Home Layout: One level Home Equipment: None Additional Comments: Reports daughter will be staying with him at d/c.     Prior Function Level of Independence: Independent               Hand Dominance        Extremity/Trunk Assessment   Upper Extremity Assessment Upper Extremity Assessment:  Defer to OT evaluation    Lower Extremity Assessment Lower Extremity Assessment: LLE deficits/detail LLE Deficits / Details: Sensory in tact. Deficits consistent with post op pain and weakness.  Able to perform ther ex below.     Cervical / Trunk Assessment Cervical / Trunk Assessment: Normal  Communication   Communication: No difficulties  Cognition Arousal/Alertness: Awake/alert Behavior During Therapy: WFL for tasks assessed/performed Overall Cognitive Status: Within Functional Limits for tasks assessed                                        General Comments General comments (skin integrity, edema, etc.): Pt's daughter present during session.     Exercises Total Joint Exercises Ankle Circles/Pumps: AROM;Both;20 reps Quad Sets: AROM;Left;10 reps Towel Squeeze: AROM;Both;10 reps Heel Slides: AROM;Left;10 reps   Assessment/Plan    PT Assessment Patient needs continued PT services  PT Problem List Decreased strength;Decreased balance;Decreased mobility;Decreased knowledge of use of DME;Pain       PT Treatment Interventions DME instruction;Gait training;Stair training;Functional mobility training;Therapeutic activities;Neuromuscular re-education;Therapeutic exercise;Balance training;Patient/family education    PT Goals (Current goals can be found in the Care Plan section)  Acute Rehab PT Goals Patient Stated Goal: to go home tomorrow.  PT Goal Formulation: With patient Time For Goal Achievement: 09/14/17 Potential to Achieve Goals: Good    Frequency 7X/week   Barriers to discharge        Co-evaluation               AM-PAC PT "6 Clicks" Daily Activity  Outcome Measure Difficulty turning over in bed (including adjusting bedclothes, sheets and blankets)?: A Little Difficulty moving from lying on back to sitting on the side of the bed? : A Little Difficulty sitting down on and standing up from a chair with arms (e.g., wheelchair, bedside commode, etc,.)?: Unable Help needed moving to and from a bed to chair (including a wheelchair)?: A Little Help needed walking in hospital room?: A Little Help needed climbing 3-5 steps with a railing? :  A Little 6 Click Score: 16    End of Session Equipment Utilized During Treatment: Gait belt;Left knee immobilizer Activity Tolerance: Patient tolerated treatment well Patient left: in chair;with call bell/phone within reach;with family/visitor present Nurse Communication: Mobility status PT Visit Diagnosis: Other abnormalities of gait and mobility (R26.89);Pain Pain - Right/Left: Left Pain - part of body: Knee    Time: [id number] PT Time Calculation (min) (ACUTE ONLY): 36 min   Charges:   PT Evaluation $PT Eval Low Complexity: 1 Low PT Treatments $Gait Training: 8-22 mins   PT G Codes:        Gladys DammeBrittany Tahmid Stonehocker, PT, DPT  Acute Rehabilitation Services  Pager: [phone]   Lehman PromBrittany S Dean Wonder 09/07/2017, 6:47 PM

## 2017-09-07 NOTE — Anesthesia Procedure Notes (Addendum)
Anesthesia Regional Block: Adductor canal block   Pre-Anesthetic Checklist: ,, timeout performed, Correct Patient, Correct Site, Correct Laterality, Correct Procedure, Correct Position, site marked, Risks and benefits discussed,  Surgical consent,  Pre-op evaluation,  At surgeon's request and post-op pain management  Laterality: Left  Prep: chloraprep       Needles:  Injection technique: Single-shot  Needle Type: Echogenic Stimulator Needle     Needle Length: 9cm  Needle Gauge: 21   Needle insertion depth: 4 cm   Additional Needles:   Procedures:,,,, ultrasound used (permanent image in chart),,,,  Narrative:  Start time: 09/07/2017 9:38 AM End time: 09/07/2017 9:50 AM Injection made incrementally with aspirations every 5 mL.  Performed by: Personally  Anesthesiologist: Leilani AbleHatchett, Lolah Coghlan, MD

## 2017-09-07 NOTE — Transfer of Care (Signed)
Immediate Anesthesia Transfer of Care Note  Patient: Terry Guerra  Procedure(s) Performed: UNICOMPARTMENTAL KNEE (Left )  Patient Location: PACU  Anesthesia Type:MAC combined with regional for post-op pain  Level of Consciousness: awake, alert , oriented and patient cooperative  Airway & Oxygen Therapy: Patient Spontanous Breathing and Patient connected to nasal cannula oxygen  Post-op Assessment: Report given to RN and Post -op Vital signs reviewed and stable  Post vital signs: Reviewed and stable  Last Vitals:  Vitals:   09/07/17 0950 09/07/17 0955  BP: (!) 165/100 (!) 148/93  Pulse: 83 85  Resp: 19 20  Temp:    SpO2: 96% 95%    Last Pain:  Vitals:   09/07/17 0817  TempSrc: Oral         Complications: No apparent anesthesia complications

## 2017-09-07 NOTE — Anesthesia Postprocedure Evaluation (Signed)
Anesthesia Post Note  Patient: Terry JollyJoseph S Guerra  Procedure(s) Performed: UNICOMPARTMENTAL KNEE (Left )     Patient location during evaluation: PACU Anesthesia Type: Spinal Level of consciousness: oriented and awake and alert Pain management: pain level controlled Vital Signs Assessment: post-procedure vital signs reviewed and stable Respiratory status: spontaneous breathing, respiratory function stable and patient connected to nasal cannula oxygen Cardiovascular status: blood pressure returned to baseline and stable Postop Assessment: no headache, no backache and no apparent nausea or vomiting Anesthetic complications: no    Last Vitals:  Vitals:   09/07/17 1345 09/07/17 1416  BP: (!) 137/94 (!) 155/95  Pulse: 79 76  Resp: 15 18  Temp:  36.5 C  SpO2: 93% 95%    Last Pain:  Vitals:   09/07/17 1345  TempSrc:   PainSc: 0-No pain                 Cree Napoli,JAMES TERRILL

## 2017-09-08 ENCOUNTER — Encounter (HOSPITAL_COMMUNITY): Payer: Self-pay | Admitting: Orthopedic Surgery

## 2017-09-08 DIAGNOSIS — I10 Essential (primary) hypertension: Secondary | ICD-10-CM | POA: Diagnosis not present

## 2017-09-08 DIAGNOSIS — Z79899 Other long term (current) drug therapy: Secondary | ICD-10-CM | POA: Diagnosis not present

## 2017-09-08 DIAGNOSIS — M1712 Unilateral primary osteoarthritis, left knee: Secondary | ICD-10-CM | POA: Diagnosis not present

## 2017-09-08 DIAGNOSIS — Z87891 Personal history of nicotine dependence: Secondary | ICD-10-CM | POA: Diagnosis not present

## 2017-09-08 NOTE — Progress Notes (Signed)
Physical Therapy Treatment Patient Details Name: Terry JollyJoseph S Haverland MRN: [id number] DOB: 09/25/1960 Today's Date: 09/08/2017    History of Present Illness Pt is a 57 y/o male s/p L partial knee replacement. PMH includes HTN and OSA.     PT Comments    Patient seen for mobility assessment s/p knee surgery. Mobilizing well. Educated patient on precautions, mobility expectations, safety and car transfers. Performed full flight of stairs without issue. Patient safe for d/c home.   Follow Up Recommendations  DC plan and follow up therapy as arranged by surgeon;Supervision for mobility/OOB     Equipment Recommendations  Rolling walker with 5" wheels;3in1 (PT)    Recommendations for Other Services       Precautions / Restrictions Precautions Precautions: Knee Precaution Booklet Issued: Yes (comment) Precaution Comments: Reviewed supine ther ex with pt.  Restrictions Weight Bearing Restrictions: No    Mobility  Bed Mobility Overal bed mobility: Needs Assistance Bed Mobility: Supine to Sit     Supine to sit: Supervision     General bed mobility comments: Supervision for safety.   Transfers Overall transfer level: Needs assistance Equipment used: Rolling walker (2 wheeled) Transfers: Sit to/from Stand Sit to Stand: Supervision         General transfer comment: supervision for safety no physical assist required  Ambulation/Gait Ambulation/Gait assistance: Supervision Ambulation Distance (Feet): 380 Feet Assistive device: Rolling walker (2 wheeled) Gait Pattern/deviations: Step-to pattern;Decreased step length - left;Decreased step length - right;Step-through pattern;Antalgic;Decreased weight shift to left Gait velocity: Decreased    General Gait Details: improved cadence   Stairs Stairs: Yes   Stair Management: Step to pattern Number of Stairs: 12 General stair comments: VCs for sequencing and technique  Wheelchair Mobility    Modified Rankin (Stroke Patients  Only)       Balance Overall balance assessment: Needs assistance Sitting-balance support: No upper extremity supported;Feet supported Sitting balance-Leahy Scale: Good     Standing balance support: Bilateral upper extremity supported;No upper extremity supported;During functional activity Standing balance-Leahy Scale: Fair Standing balance comment: able to release UE support without difficulty                            Cognition Arousal/Alertness: Awake/alert Behavior During Therapy: WFL for tasks assessed/performed Overall Cognitive Status: Within Functional Limits for tasks assessed                                        Exercises Total Joint Exercises Ankle Circles/Pumps: AROM;Both;20 reps    General Comments        Pertinent Vitals/Pain Pain Assessment: 0-10 Pain Score: 5  Pain Location: L knee  Pain Descriptors / Indicators: Aching;Operative site guarding Pain Intervention(s): Monitored during session;Premedicated before session    Home Living                      Prior Function            PT Goals (current goals can now be found in the care plan section) Acute Rehab PT Goals Patient Stated Goal: to go home tomorrow.  PT Goal Formulation: With patient Time For Goal Achievement: 09/14/17 Potential to Achieve Goals: Good Progress towards PT goals: Progressing toward goals    Frequency    7X/week      PT Plan Current plan remains appropriate    Co-evaluation  AM-PAC PT "6 Clicks" Daily Activity  Outcome Measure  Difficulty turning over in bed (including adjusting bedclothes, sheets and blankets)?: A Little Difficulty moving from lying on back to sitting on the side of the bed? : A Little Difficulty sitting down on and standing up from a chair with arms (e.g., wheelchair, bedside commode, etc,.)?: A Little Help needed moving to and from a bed to chair (including a wheelchair)?: A Little Help  needed walking in hospital room?: A Little Help needed climbing 3-5 steps with a railing? : A Little 6 Click Score: 18    End of Session Equipment Utilized During Treatment: Gait belt;Left knee immobilizer Activity Tolerance: Patient tolerated treatment well Patient left: in chair;with call bell/phone within reach;with family/visitor present Nurse Communication: Mobility status PT Visit Diagnosis: Other abnormalities of gait and mobility (R26.89);Pain Pain - Right/Left: Left Pain - part of body: Knee     Time: 1610-9604 PT Time Calculation (min) (ACUTE ONLY): 20 min  Charges:  $Gait Training: 8-22 mins                    G Codes:       Charlotte Crumb, PT DPT  Board Certified Neurologic Specialist [phone]    Fabio Asa 09/08/2017, 8:01 AM

## 2017-09-08 NOTE — Evaluation (Signed)
Occupational Therapy Evaluation and Discharge Patient Details Name: Terry Guerra MRN: [id number] DOB: 02/07/1961 Today's Date: 09/08/2017    History of Present Illness Pt is a 57 y/o male s/p L partial knee replacement. PMH includes HTN and OSA.    Clinical Impression   Pt reports he was independent with ADL PTA. Currently pt overall supervision for ADL and functional mobility. Pt planning to d/c home with supervision from family initially. No further acute OT needs identified; signing off at this time. Please re-consult if needs change. Thank you for this referral.    Follow Up Recommendations  No OT follow up;Supervision - Intermittent    Equipment Recommendations  None recommended by OT    Recommendations for Other Services       Precautions / Restrictions Precautions Precautions: Knee Precaution Booklet Issued: No Restrictions Weight Bearing Restrictions: Yes LLE Weight Bearing: Weight bearing as tolerated      Mobility Bed Mobility      General bed mobility comments: Pt OOB in chair upon arrival  Transfers Overall transfer level: Needs assistance Equipment used: None Transfers: Sit to/from Stand Sit to Stand: Supervision         General transfer comment: for safety, no physical assist or use of RW    Balance Overall balance assessment: Needs assistance Sitting-balance support: Feet supported;No upper extremity supported Sitting balance-Leahy Scale: Good     Standing balance support: No upper extremity supported;During functional activity Standing balance-Leahy Scale: Good                            ADL either performed or assessed with clinical judgement   ADL Overall ADL's : Needs assistance/impaired Eating/Feeding: Independent;Sitting   Grooming: Supervision/safety;Standing   Upper Body Bathing: Set up;Sitting   Lower Body Bathing: Supervison/ safety;Sit to/from stand   Upper Body Dressing : Set up;Sitting   Lower Body  Dressing: Supervision/safety;Sit to/from stand   Toilet Transfer: Supervision/safety;Ambulation Toilet Transfer Details (indicate cue type and reason): Practiced with standard height toilet and use of no UE support to simulate home environment; pt able to perform without physical assist     Tub/ Shower Transfer: Supervision/safety;Tub transfer;Ambulation Tub/Shower Transfer Details (indicate cue type and reason): Simulated in room with supervision; no physical assist required Functional mobility during ADLs: Supervision/safety       Vision         Perception     Praxis      Pertinent Vitals/Pain Pain Assessment: Faces Pain Score: 5  Faces Pain Scale: Hurts little more Pain Location: L knee Pain Descriptors / Indicators: Sore Pain Intervention(s): Monitored during session     Hand Dominance     Extremity/Trunk Assessment Upper Extremity Assessment Upper Extremity Assessment: Overall WFL for tasks assessed   Lower Extremity Assessment Lower Extremity Assessment: Defer to PT evaluation   Cervical / Trunk Assessment Cervical / Trunk Assessment: Normal   Communication Communication Communication: No difficulties   Cognition Arousal/Alertness: Awake/alert Behavior During Therapy: WFL for tasks assessed/performed Overall Cognitive Status: Within Functional Limits for tasks assessed                                     General Comments       Exercises    Shoulder Instructions      Home Living Family/patient expects to be discharged to:: Private residence Living Arrangements: Alone Available Help at  Discharge: Family;Available 24 hours/day Type of Home: House Home Access: Stairs to enter [employer] of Steps: 12; 4 at back with no rail. Entrance Stairs-Rails: Right;Left;Can reach both Home Layout: One level     Bathroom Shower/Tub: IT trainer: Standard     Home Equipment: None          Prior  Functioning/Environment Level of Independence: Independent                 OT Problem List:        OT Treatment/Interventions:      OT Goals(Current goals can be found in the care plan section) Acute Rehab OT Goals Patient Stated Goal: home today OT Goal Formulation: All assessment and education complete, DC therapy  OT Frequency:     Barriers to D/C:            Co-evaluation              AM-PAC PT "6 Clicks" Daily Activity     Outcome Measure Help from another person eating meals?: None Help from another person taking care of personal grooming?: A Little Help from another person toileting, which includes using toliet, bedpan, or urinal?: A Little Help from another person bathing (including washing, rinsing, drying)?: A Little Help from another person to put on and taking off regular upper body clothing?: None Help from another person to put on and taking off regular lower body clothing?: A Little 6 Click Score: 20   End of Session CPM Left Knee Additional Comments: (cpm off) Nurse Communication: Mobility status;Other (comment)(no equipment or f/u needs)  Activity Tolerance: Patient tolerated treatment well Patient left: in chair;with call bell/phone within reach  OT Visit Diagnosis: Other abnormalities of gait and mobility (R26.89)                Time: 2956-2130 OT Time Calculation (min): 11 min Charges:  OT General Charges $OT Visit: 1 Visit OT Evaluation $OT Eval Low Complexity: 1 Low G-Codes:     Rahm Minix A. Brett Albino, M.S., OTR/L Pager: [phone]  Gaye Alken 09/08/2017, 9:15 AM

## 2017-09-08 NOTE — Progress Notes (Signed)
   Assessment / Plan: 1 Day Post-Op  S/P Procedure(s) (LRB): UNICOMPARTMENTAL KNEE (Left) by Dr. Jewel Baizeimothy D. Murphy on 09/07/17  Principal Problem:   Unilateral primary osteoarthritis, left knee Active Problems:   HTN (hypertension)   Dyslipidemia   OSA (obstructive sleep apnea)   Primary osteoarthritis of knee   Unicompartmental OA Left knee Doing well POD1. Eating, drinking, voiding, and mobilizing.  Pain controlled.  Up with therapy Incentive Spirometry Elevate and apply ice  Weight Bearing: Weight Bearing as Tolerated (WBAT)  Dressings: Mepilex.  VTE prophylaxis: Aspirin, SCDs, ambulation Dispo: Home this morning after therapy session.  Subjective: Patient reports pain as mild to moderate.  Tolerating diet.  Urinating.  No CP, SOB.  Mobilizing well.  Objective:   VITALS:   Vitals:   09/07/17 1500 09/07/17 2000 09/08/17 0000 09/08/17 0400  BP: (!) 147/93 (!) 157/95 (!) 148/93 126/90  Pulse: 84 (!) 106 (!) 103 87  Resp: 18 20 20 18   Temp: (!) 97.5 F (36.4 C)  98 F (36.7 C) 98.2 F (36.8 C)  TempSrc:   Oral Oral  SpO2: 95% 95% 95% 97%  Weight:       CBC Latest Ref Rng & Units 08/26/2017 06/01/2017 12/09/2016  WBC 4.0 - 10.5 K/uL 5.1 5.7 5.1  Hemoglobin 13.0 - 17.0 g/dL 16.115.0 09.614.5 04.515.4  Hematocrit 39.0 - 52.0 % 42.8 42.8 43.4  Platelets 150 - 400 K/uL 273 305 306   BMP Latest Ref Rng & Units 08/26/2017 06/01/2017 12/09/2016  Glucose 65 - 99 mg/dL 409(W111(H) 96 119(J135(H)  BUN 6 - 20 mg/dL 13 10 13   Creatinine 0.61 - 1.24 mg/dL 4.780.85 2.950.94 6.21(H0.70(L)  BUN/Creat Ratio 9 - 20 - 11 19  Sodium 135 - 145 mmol/L 137 141 140  Potassium 3.5 - 5.1 mmol/L 3.7 4.1 3.9  Chloride 101 - 111 mmol/L 105 106 100  CO2 22 - 32 mmol/L 22 20 22   Calcium 8.9 - 10.3 mg/dL 9.9 08.610.0 9.9   Intake/Output      01/15 0701 - 01/16 0700 01/16 0701 - 01/17 0700   P.O. 480    I.V. (mL/kg) 1000 (10.2)    Total Intake(mL/kg) 1480 (15.1)    Urine (mL/kg/hr) 1301    Blood 50    Total Output 1351    Net  +129         Urine Occurrence 4 x       Physical Exam: General: NAD.  Upright in bed.  Calm, conversant. Resp: No increased wob Cardio: regular rate and rhythm ABD soft Neurologically intact MSK Neurovascularly intact Sensation intact distally Feet warm Dorsiflexion/Plantar flexion intact Incision: dressing C/D/I  Albina BilletHenry Calvin Martensen III, PA-C 09/08/2017, 7:49 AM

## 2017-09-08 NOTE — Care Management Note (Signed)
Case Management Note  Patient Details  Name: Terry Guerra MRN: [id number] Date of Birth: 04/20/1961  Subjective/Objective:    57 yr old male s/p left unicompartmental knee arthroplasty.                Action/Plan: Patient will be going directly to outpatient therapy . DME has been delivered to the room, he will have family support at discharge.    Expected Discharge Date:  09/08/17               Expected Discharge Plan:  OP Rehab  In-House Referral:  NA  Discharge planning Services  CM Consult  Post Acute Care Choice:  Durable Medical Equipment Choice offered to:     DME Arranged:  3-N-1, Walker rolling(declines CPM) DME Agency:  [facility]  HH Arranged:  NA HH Agency:  NA  Status of Service:  Completed, signed off  If discussed at Long Length of Stay Meetings, dates discussed:    Additional Comments:  Terry Guerra, Terry Mcmurtry Naomi, RN 09/08/2017, 8:56 AM

## 2017-09-08 NOTE — Discharge Summary (Signed)
Discharge Summary  Patient ID: Terry Guerra MRN: [id number] DOB/AGE: 58/08/1960 58 y.o.  Admit date: 09/07/2017 Discharge date: 09/08/2017  Admission Diagnoses:  Unilateral primary osteoarthritis, left knee  Discharge Diagnoses:  Principal Problem:   Unilateral primary osteoarthritis, left knee Active Problems:   HTN (hypertension)   Dyslipidemia   OSA (obstructive sleep apnea)   Primary osteoarthritis of knee   Past Medical History:  Diagnosis Date  . Arthritis    knees  . Hypertension   . Sleep apnea    no CPAP    Surgeries: Procedure(s): UNICOMPARTMENTAL KNEE on 09/07/2017   Consultants (if any):   Discharged Condition: Improved  Hospital Course: Terry Guerra is an 57 y.o. male who was admitted 09/07/2017 with a diagnosis of Unilateral primary osteoarthritis, left knee and went to the operating room on 09/07/2017 and underwent the above named procedures.    He was given perioperative antibiotics:  Anti-infectives (From admission, onward)   Start     Dose/Rate Route Frequency Ordered Stop   09/07/17 1600  ceFAZolin (ANCEF) IVPB 1 g/50 mL premix     1 g 100 mL/hr over 30 Minutes Intravenous Every 6 hours 09/07/17 1413 09/07/17 2225   09/07/17 1000  ceFAZolin (ANCEF) IVPB 2g/100 mL premix     2 g 200 mL/hr over 30 Minutes Intravenous To [facility] 09/06/17 1216 09/07/17 1019    .  He was given sequential compression devices, early ambulation, and ASA for DVT prophylaxis.  He benefited maximally from the hospital stay and there were no complications.    Recent vital signs:  Vitals:   09/08/17 0000 09/08/17 0400  BP: (!) 148/93 126/90  Pulse: (!) 103 87  Resp: 20 18  Temp: 98 F (36.7 C) 98.2 F (36.8 C)  SpO2: 95% 97%    Recent laboratory studies:  Lab Results  Component Value Date   HGB 15.0 08/26/2017   HGB 14.5 06/01/2017   HGB 15.4 12/09/2016   Lab Results  Component Value Date   WBC 5.1 08/26/2017   PLT 273 08/26/2017   Lab  Results  Component Value Date   INR 0.9 05/12/2009   Lab Results  Component Value Date   NA 137 08/26/2017   K 3.7 08/26/2017   CL 105 08/26/2017   CO2 22 08/26/2017   BUN 13 08/26/2017   CREATININE 0.85 08/26/2017   GLUCOSE 111 (H) 08/26/2017    Discharge Medications:   Allergies as of 09/08/2017   No Known Allergies     Medication List    TAKE these medications   acetaminophen 500 MG tablet Commonly known as:  TYLENOL Take 2 tablets (1,000 mg total) by mouth every 8 (eight) hours for 14 days. For Pain. What changed:    when to take this  additional instructions   amLODipine 10 MG tablet Commonly known as:  NORVASC Take 1 tablet (10 mg total) by mouth daily.   aspirin EC 325 MG tablet Take 1 tablet (325 mg total) by mouth daily. For 30 days post op for DVT Prophylaxis   [employer] SLEEP AID PO Take 2-3 tablets by mouth at bedtime as needed (SLEEP). Depends on insomnia if takes 2-3 tablets   docusate sodium 100 MG capsule Commonly known as:  COLACE Take 1 capsule (100 mg total) by mouth 2 (two) times daily. To prevent constipation while taking pain medication.   DUEXIS 800-26.6 MG Tabs Generic drug:  Ibuprofen-Famotidine Take 1 tablet by mouth 3 (three) times daily as needed (PAIN).  ICY HOT EX Apply 1 application topically 3 (three) times daily as needed (PAIN).   methocarbamol 500 MG tablet Commonly known as:  ROBAXIN Take 1 tablet (500 mg total) by mouth every 6 (six) hours as needed for muscle spasms.   ondansetron 4 MG tablet Commonly known as:  ZOFRAN Take 1 tablet (4 mg total) by mouth every 8 (eight) hours as needed for nausea or vomiting.   oxyCODONE 5 MG immediate release tablet Commonly known as:  ROXICODONE Take 1-2 tablets (5-10 mg total) by mouth every 4 (four) hours as needed for breakthrough pain.   pravastatin 40 MG tablet Commonly known as:  PRAVACHOL Take 1 tablet (40 mg total) by mouth daily.   sodium chloride 0.65 % nasal  spray Commonly known as:  OCEAN Place 1 spray into the nose at bedtime as needed for congestion.       Diagnostic Studies: Dg Knee Left Port  Result Date: 09/07/2017 CLINICAL DATA:  Status post left hemiarthroplasty. EXAM: PORTABLE LEFT KNEE - 1-2 VIEW COMPARISON:  Radiographs of September 13, 2013. FINDINGS: Status post left medial hemiarthroplasty. The medial femoral condylar and medial tibial plateau components are well situated. No fracture or dislocation is noted. Expected postoperative changes are noted in the soft tissues anteriorly. IMPRESSION: Status post left medial hemiarthroplasty. Electronically Signed   By: Lupita Raider, M.D.   On: 09/07/2017 12:44    Disposition: Final discharge disposition not confirmed  Discharge Instructions    Discharge patient   Complete by:  As directed    Discharge disposition:  01-Home or Self Care   Discharge patient date:  09/08/2017      Follow-up Information    Sheral Apley, MD.   Specialty:  Orthopedic Surgery Contact information: [address] [place] [address] [phone]            Signed: Albina Billet III PA-C 09/08/2017, 7:54 AM

## 2017-09-08 NOTE — Progress Notes (Signed)
Patient alert and oriented, mae's well, voiding adequate amount of urine, swallowing without difficulty, no c/o pain at time of discharge. Patient discharged home with family. Script and discharged instructions given to patient. Patient and family stated understanding of instructions given. Patient has an appointment with Dr. Murphy    

## 2017-09-09 DIAGNOSIS — R262 Difficulty in walking, not elsewhere classified: Secondary | ICD-10-CM | POA: Diagnosis not present

## 2017-09-09 DIAGNOSIS — R531 Weakness: Secondary | ICD-10-CM | POA: Diagnosis not present

## 2017-09-09 DIAGNOSIS — M25562 Pain in left knee: Secondary | ICD-10-CM | POA: Diagnosis not present

## 2017-09-09 DIAGNOSIS — M25662 Stiffness of left knee, not elsewhere classified: Secondary | ICD-10-CM | POA: Diagnosis not present

## 2017-09-15 DIAGNOSIS — M25662 Stiffness of left knee, not elsewhere classified: Secondary | ICD-10-CM | POA: Diagnosis not present

## 2017-09-15 DIAGNOSIS — M25562 Pain in left knee: Secondary | ICD-10-CM | POA: Diagnosis not present

## 2017-09-15 DIAGNOSIS — R531 Weakness: Secondary | ICD-10-CM | POA: Diagnosis not present

## 2017-09-15 DIAGNOSIS — R262 Difficulty in walking, not elsewhere classified: Secondary | ICD-10-CM | POA: Diagnosis not present

## 2017-09-17 DIAGNOSIS — R262 Difficulty in walking, not elsewhere classified: Secondary | ICD-10-CM | POA: Diagnosis not present

## 2017-09-17 DIAGNOSIS — R531 Weakness: Secondary | ICD-10-CM | POA: Diagnosis not present

## 2017-09-17 DIAGNOSIS — M25662 Stiffness of left knee, not elsewhere classified: Secondary | ICD-10-CM | POA: Diagnosis not present

## 2017-09-17 DIAGNOSIS — M25562 Pain in left knee: Secondary | ICD-10-CM | POA: Diagnosis not present

## 2017-09-20 DIAGNOSIS — M25662 Stiffness of left knee, not elsewhere classified: Secondary | ICD-10-CM | POA: Diagnosis not present

## 2017-09-20 DIAGNOSIS — R262 Difficulty in walking, not elsewhere classified: Secondary | ICD-10-CM | POA: Diagnosis not present

## 2017-09-20 DIAGNOSIS — R531 Weakness: Secondary | ICD-10-CM | POA: Diagnosis not present

## 2017-09-20 DIAGNOSIS — M25562 Pain in left knee: Secondary | ICD-10-CM | POA: Diagnosis not present

## 2017-09-22 DIAGNOSIS — M25562 Pain in left knee: Secondary | ICD-10-CM | POA: Diagnosis not present

## 2017-09-23 DIAGNOSIS — M25662 Stiffness of left knee, not elsewhere classified: Secondary | ICD-10-CM | POA: Diagnosis not present

## 2017-09-23 DIAGNOSIS — M25562 Pain in left knee: Secondary | ICD-10-CM | POA: Diagnosis not present

## 2017-09-23 DIAGNOSIS — R531 Weakness: Secondary | ICD-10-CM | POA: Diagnosis not present

## 2017-09-23 DIAGNOSIS — M1711 Unilateral primary osteoarthritis, right knee: Secondary | ICD-10-CM | POA: Diagnosis not present

## 2017-09-30 DIAGNOSIS — M25562 Pain in left knee: Secondary | ICD-10-CM | POA: Diagnosis not present

## 2017-09-30 DIAGNOSIS — R531 Weakness: Secondary | ICD-10-CM | POA: Diagnosis not present

## 2017-09-30 DIAGNOSIS — R262 Difficulty in walking, not elsewhere classified: Secondary | ICD-10-CM | POA: Diagnosis not present

## 2017-09-30 DIAGNOSIS — M25662 Stiffness of left knee, not elsewhere classified: Secondary | ICD-10-CM | POA: Diagnosis not present

## 2017-10-07 DIAGNOSIS — M1712 Unilateral primary osteoarthritis, left knee: Secondary | ICD-10-CM | POA: Diagnosis not present

## 2017-10-07 DIAGNOSIS — M25562 Pain in left knee: Secondary | ICD-10-CM | POA: Diagnosis not present

## 2017-10-07 DIAGNOSIS — M25662 Stiffness of left knee, not elsewhere classified: Secondary | ICD-10-CM | POA: Diagnosis not present

## 2017-10-07 DIAGNOSIS — R262 Difficulty in walking, not elsewhere classified: Secondary | ICD-10-CM | POA: Diagnosis not present

## 2017-10-20 DIAGNOSIS — M1711 Unilateral primary osteoarthritis, right knee: Secondary | ICD-10-CM | POA: Diagnosis not present

## 2017-10-20 DIAGNOSIS — M25662 Stiffness of left knee, not elsewhere classified: Secondary | ICD-10-CM | POA: Diagnosis not present

## 2017-10-20 DIAGNOSIS — M25562 Pain in left knee: Secondary | ICD-10-CM | POA: Diagnosis not present

## 2017-10-20 DIAGNOSIS — R262 Difficulty in walking, not elsewhere classified: Secondary | ICD-10-CM | POA: Diagnosis not present

## 2017-10-20 DIAGNOSIS — M1712 Unilateral primary osteoarthritis, left knee: Secondary | ICD-10-CM | POA: Diagnosis not present

## 2018-02-09 IMAGING — CR DG CHEST 2V
3 series · 3 of 3 positions shown · non-contrast
Comparison: Chest x-ray 05/12/2009

CLINICAL DATA: Shortness of breath with the exertion.

EXAM:
CHEST  2 VIEW

[PA]
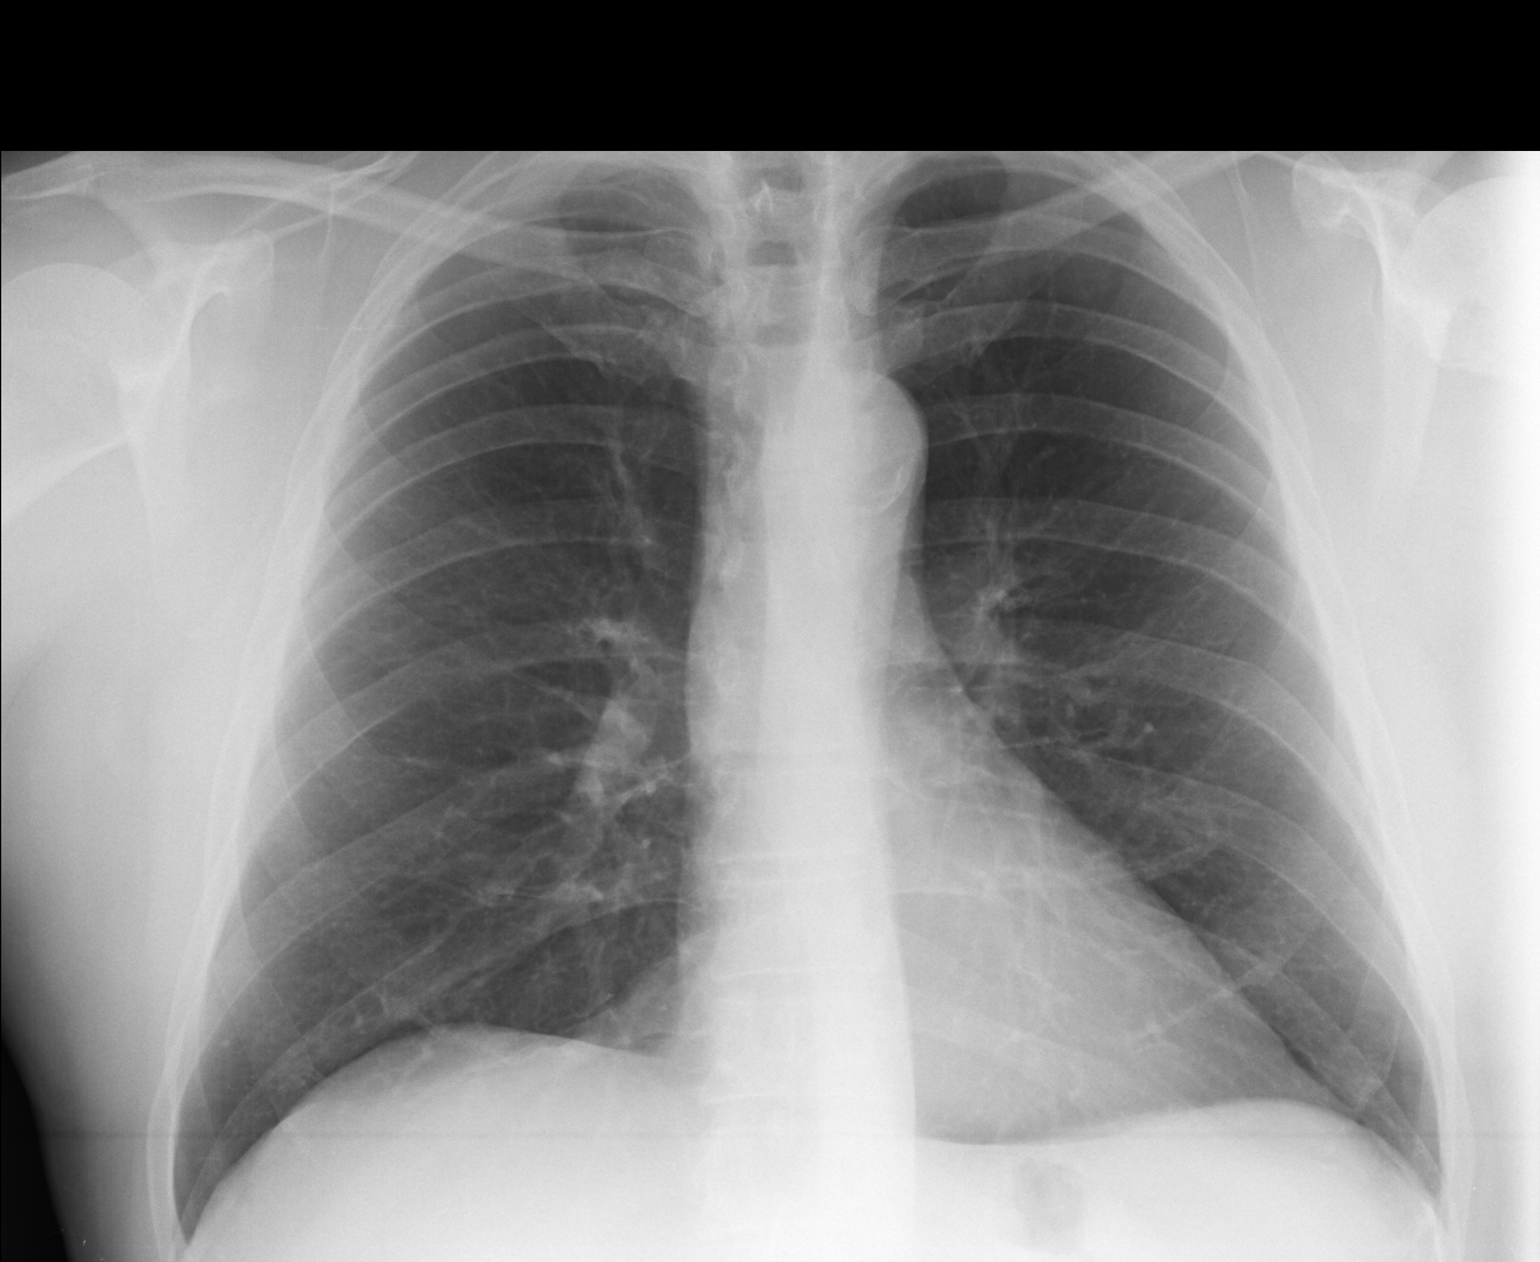

[lateral (1 of 2)]
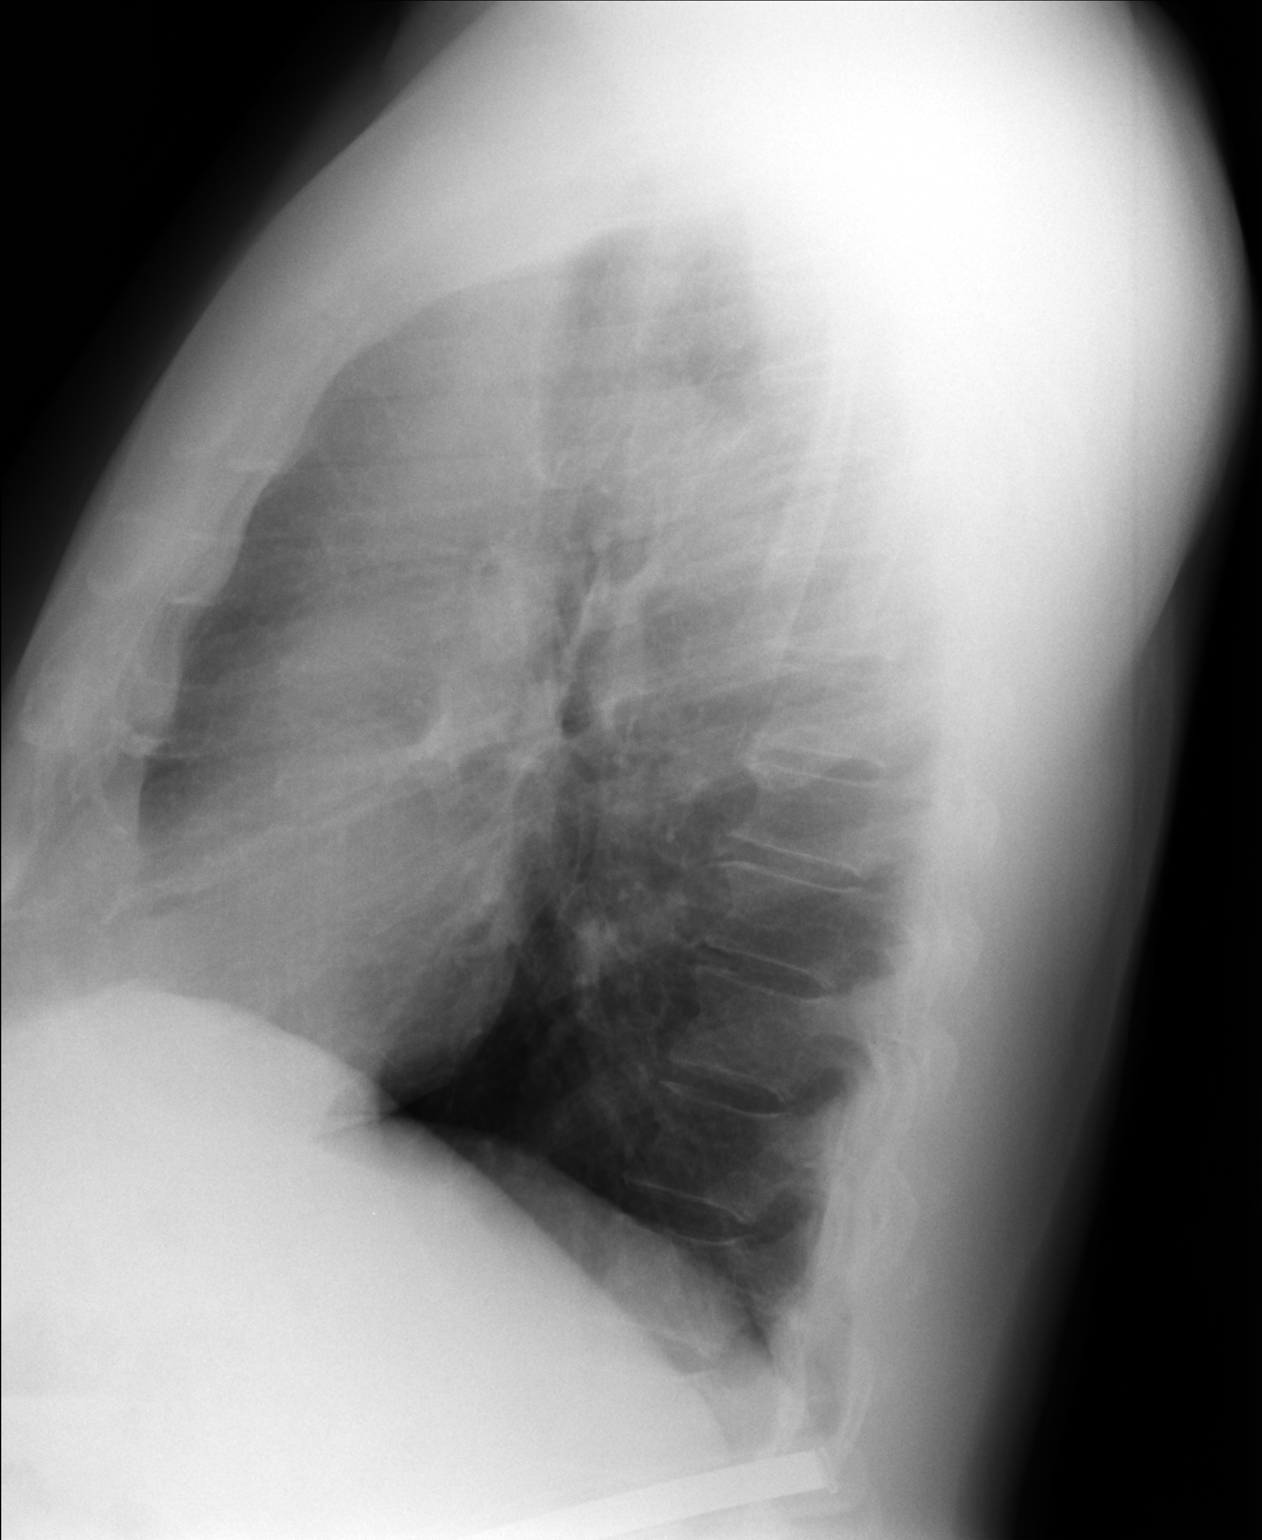

[lateral (2 of 2)]
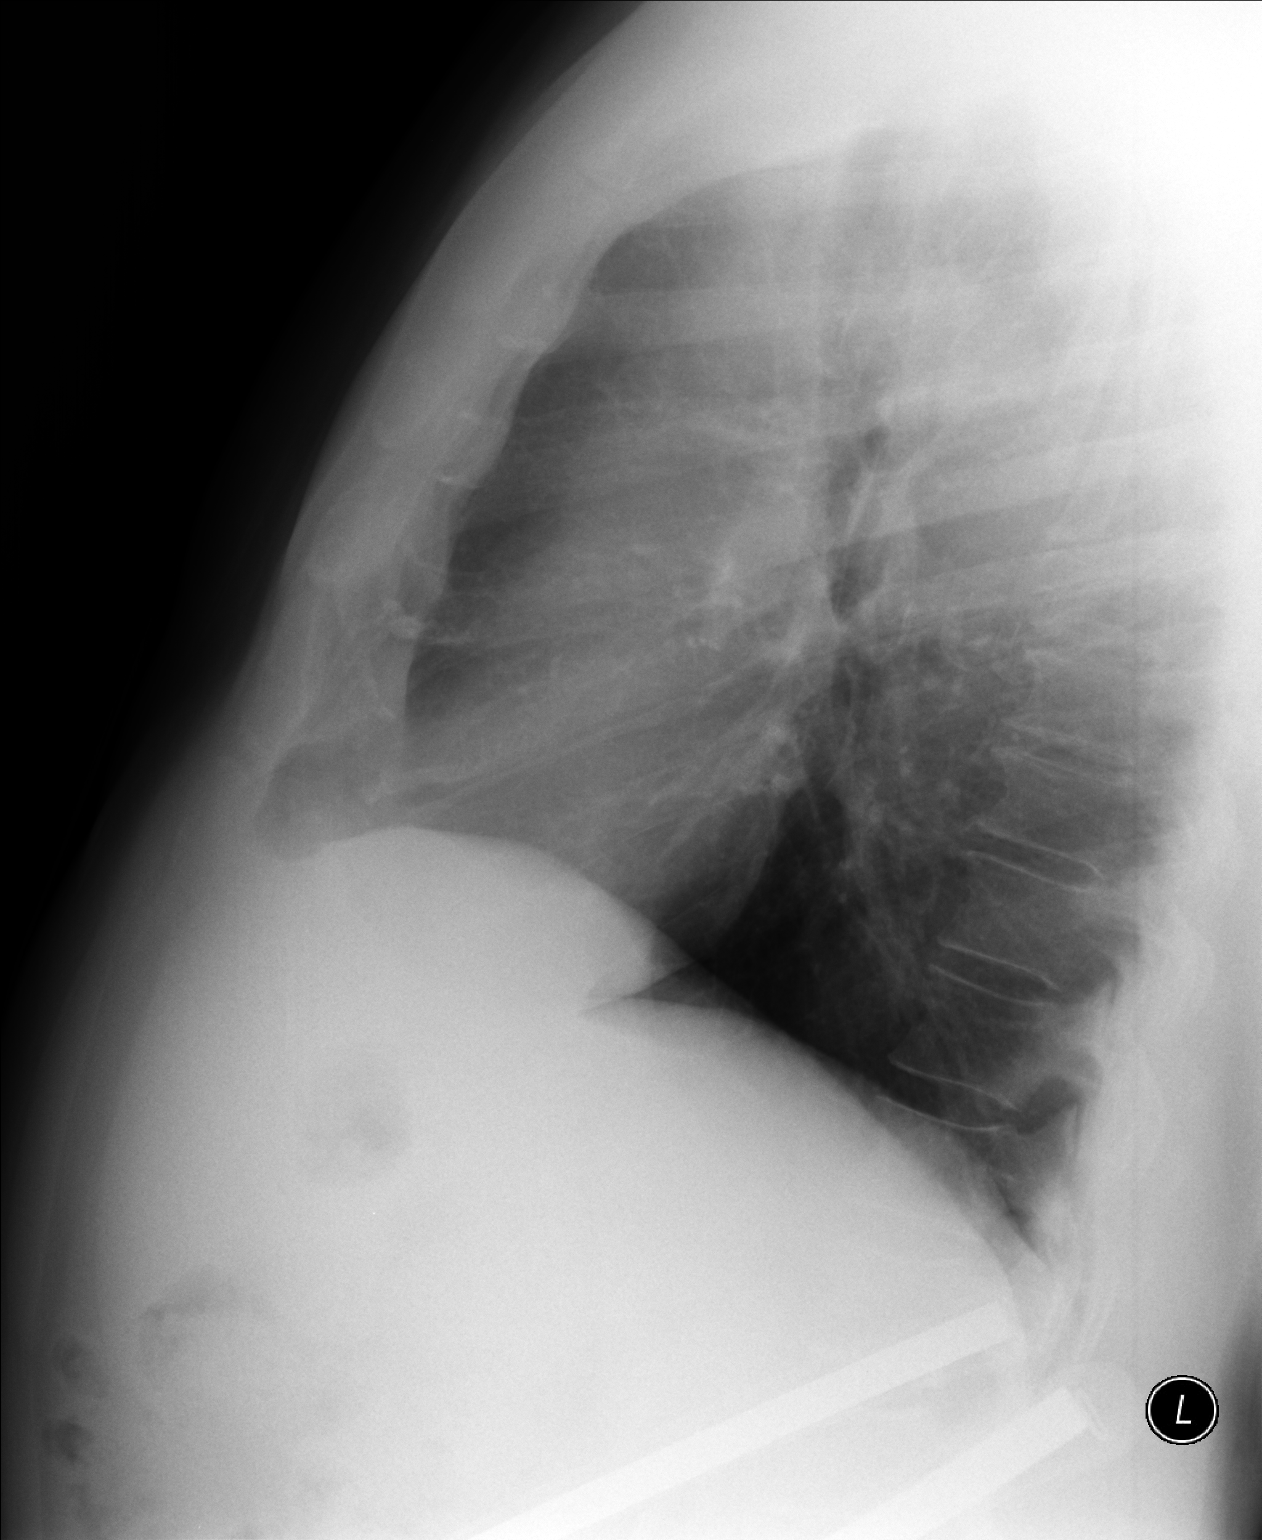

[3 of 3 positions shown; findings below may reference images not displayed]

FINDINGS: The heart size and mediastinal contours are within normal limits.
Both lungs are clear. The visualized skeletal structures are
unremarkable.
IMPRESSION: Normal chest.

## 2018-02-22 ENCOUNTER — Encounter: Payer: Self-pay | Admitting: Emergency Medicine

## 2018-02-22 NOTE — Telephone Encounter (Signed)
[facility]

## 2018-04-20 DIAGNOSIS — M1711 Unilateral primary osteoarthritis, right knee: Secondary | ICD-10-CM | POA: Diagnosis not present

## 2018-04-26 ENCOUNTER — Telehealth: Payer: Self-pay | Admitting: *Deleted

## 2018-04-26 NOTE — Telephone Encounter (Signed)
Patient's pre-operative clearance form is located at Nurse's station ([phone]) in the Dr Latrelle Dodrill slot. Dr Alvy Bimler will not see patient because of his disrespectfulness and profanity message to the doctor.

## 2018-04-26 NOTE — Telephone Encounter (Signed)
Left message in voice mail of mobile number, schedule an appointment for pre-operative clearance exam for the right partial knee replacement.  Patient he is not allow to schedule with Dr Alvy Bimler, because he was very disrespectful and used profanity toward the doctor in his e-mail. Schedule appointment with another provider in the practice.

## 2018-05-31 ENCOUNTER — Other Ambulatory Visit: Payer: Self-pay

## 2018-05-31 ENCOUNTER — Ambulatory Visit (INDEPENDENT_AMBULATORY_CARE_PROVIDER_SITE_OTHER): Payer: BLUE CROSS/BLUE SHIELD | Admitting: Family Medicine

## 2018-05-31 ENCOUNTER — Encounter: Payer: Self-pay | Admitting: Emergency Medicine

## 2018-05-31 VITALS — BP 152/88 | HR 79 | Temp 97.8°F | Resp 16 | Ht 68.0 in | Wt 213.4 lb

## 2018-05-31 DIAGNOSIS — Z01818 Encounter for other preprocedural examination: Secondary | ICD-10-CM

## 2018-05-31 DIAGNOSIS — I10 Essential (primary) hypertension: Secondary | ICD-10-CM | POA: Diagnosis not present

## 2018-05-31 DIAGNOSIS — Z789 Other specified health status: Secondary | ICD-10-CM

## 2018-05-31 DIAGNOSIS — Z7289 Other problems related to lifestyle: Secondary | ICD-10-CM

## 2018-05-31 LAB — POCT CBC
GRANULOCYTE PERCENT: 56.7 % (ref 37–80)
HCT, POC: 43.8 % (ref 43.5–53.7)
Hemoglobin: 15.2 g/dL (ref 14.1–18.1)
Lymph, poc: 1.9 (ref 0.6–3.4)
MCH, POC: 30.6 pg (ref 27–31.2)
MCHC: 34.6 g/dL (ref 31.8–35.4)
MCV: 88.4 fL (ref 80–97)
MID (CBC): 0.4 (ref 0–0.9)
MPV: 7 fL (ref 0–99.8)
PLATELET COUNT, POC: 321 10*3/uL (ref 142–424)
POC Granulocyte: 3.1 (ref 2–6.9)
POC LYMPH %: 35.3 % (ref 10–50)
POC MID %: 8 % (ref 0–12)
RBC: 4.95 M/uL (ref 4.69–6.13)
RDW, POC: 13.1 %
WBC: 5.4 10*3/uL (ref 4.6–10.2)

## 2018-05-31 LAB — POCT URINALYSIS DIP (MANUAL ENTRY)
BILIRUBIN UA: NEGATIVE
BILIRUBIN UA: NEGATIVE mg/dL
Glucose, UA: NEGATIVE mg/dL
Leukocytes, UA: NEGATIVE
NITRITE UA: NEGATIVE
PH UA: 6 (ref 5.0–8.0)
Protein Ur, POC: 30 mg/dL — AB
RBC UA: NEGATIVE
SPEC GRAV UA: 1.025 (ref 1.010–1.025)
UROBILINOGEN UA: 0.2 U/dL

## 2018-05-31 MED ORDER — LISINOPRIL 10 MG PO TABS
10.0000 mg | ORAL_TABLET | Freq: Every day | ORAL | 3 refills | Status: DC
Start: 2018-05-31 — End: 2019-05-23

## 2018-05-31 NOTE — Progress Notes (Signed)
Chief Complaint  Patient presents with  . Pre-op Exam    right knee on 07/26/2018     HPI  Preop Patient reports that he had previously 09/07/2017 He reports that he had no issues with anesthesia He currently has a surgery 07/26/18 for the right knee  He sees [facility]  He reports that he takes NSAIDs for pain and pravastatin He is not on aspirin  He denies any history of heart attacks No COPD He has sleep apnea but does not use a cpap  He does not smoke He drinks alcohol a few times a week socially but can be heavy drinker at those times He does not feel like he needs to Cut down Does not feel Annoyed by others commenting on his drinking and does not get much comments that he is drinking too much Does not feel Guilty about his drinking  Does not have an Eye opener drink  Hypertension: Patient here for follow-up of elevated blood pressure. He is exercising at work and is adherent to low salt diet.  Blood pressure is not well controlled at home. Cardiac symptoms none. Patient denies chest pain, chest pressure/discomfort, claudication, exertional chest pressure/discomfort, fatigue and irregular heart beat.  Cardiovascular risk factors: advanced age (older than 65 for men, 75 for women), dyslipidemia, hypertension and male gender. Use of agents associated with hypertension: none. History of target organ damage: none.  BP Readings from Last 3 Encounters:  05/31/18 (!) 152/88  09/08/17 (!) 146/94  08/26/17 (!) 161/89     Past Medical History:  Diagnosis Date  . Arthritis    knees  . Hypertension   . Sleep apnea    no CPAP    Current Outpatient Medications  Medication Sig Dispense Refill  . DiphenhydrAMINE HCl, Sleep, ([facility] SLEEP AID PO) Take 2-3 tablets by mouth at bedtime as needed (SLEEP). Depends on insomnia if takes 2-3 tablets    . DUEXIS 800-26.6 MG TABS Take 1 tablet by mouth 3 (three) times daily as needed (PAIN).   2  . pravastatin (PRAVACHOL) 40 MG  tablet Take 1 tablet (40 mg total) by mouth daily. 90 tablet 3  . amLODipine (NORVASC) 10 MG tablet Take 1 tablet (10 mg total) by mouth daily. 90 tablet 3  . aspirin EC 325 MG tablet Take 1 tablet (325 mg total) by mouth daily. For 30 days post op for DVT Prophylaxis (Patient not taking: Reported on 05/31/2018) 30 tablet 0  . docusate sodium (COLACE) 100 MG capsule Take 1 capsule (100 mg total) by mouth 2 (two) times daily. To prevent constipation while taking pain medication. 60 capsule 0  . lisinopril (PRINIVIL,ZESTRIL) 10 MG tablet Take 1 tablet (10 mg total) by mouth daily. 90 tablet 3  . Menthol, Topical Analgesic, (ICY HOT EX) Apply 1 application topically 3 (three) times daily as needed (PAIN).    Marland Kitchen methocarbamol (ROBAXIN) 500 MG tablet Take 1 tablet (500 mg total) by mouth every 6 (six) hours as needed for muscle spasms. 40 tablet 0  . ondansetron (ZOFRAN) 4 MG tablet Take 1 tablet (4 mg total) by mouth every 8 (eight) hours as needed for nausea or vomiting. 40 tablet 0  . sodium chloride (OCEAN) 0.65 % nasal spray Place 1 spray into the nose at bedtime as needed for congestion.     No current facility-administered medications for this visit.     Allergies: No Known Allergies  Past Surgical History:  Procedure Laterality Date  . COLONOSCOPY    .  HERNIA REPAIR    . NASAL SEPTUM SURGERY    . PARTIAL KNEE ARTHROPLASTY Left 09/07/2017   Procedure: UNICOMPARTMENTAL KNEE;  Surgeon: Sheral Apley, MD;  Location: [facility];  Service: Orthopedics;  Laterality: Left;    Social History   Socioeconomic History  . Marital status: Divorced    Spouse name: Erskine Squibb   . Number of children: 1  . Years of education: 102  . Highest education level: Not on file  Occupational History  . Occupation: Music therapist   Social Needs  . Financial resource strain: Not on file  . Food insecurity:    Worry: Not on file    Inability: Not on file  . Transportation needs:    Medical: Not on file    Non-medical:  Not on file  Tobacco Use  . Smoking status: Former Games developer  . Smokeless tobacco: Never Used  . Tobacco comment: Quit 1999  Substance and Sexual Activity  . Alcohol use: Yes    Alcohol/week: 12.0 standard drinks    Types: 12 Standard drinks or equivalent per week  . Drug use: No  . Sexual activity: Yes    Birth control/protection: None  Lifestyle  . Physical activity:    Days per week: Not on file    Minutes per session: Not on file  . Stress: Not on file  Relationships  . Social connections:    Talks on phone: Not on file    Gets together: Not on file    Attends religious service: Not on file    Active member of club or organization: Not on file    Attends meetings of clubs or organizations: Not on file    Relationship status: Not on file  Other Topics Concern  . Not on file  Social History Narrative   Drinks 1 cup of coffee daily     Family History  Problem Relation Age of Onset  . Cancer Mother   . Diabetes Father   . Allergies Brother      Review of Systems  Constitutional: Negative for chills, fever and weight loss.  HENT: Negative for hearing loss and tinnitus.   Eyes: Negative for blurred vision and double vision.  Respiratory: Negative for cough, shortness of breath and wheezing.   Cardiovascular: Negative for chest pain and palpitations.  Gastrointestinal: Negative for abdominal pain, nausea and vomiting.  Genitourinary: Negative for dysuria and urgency.  Skin: Negative for itching and rash.  Neurological: Negative for dizziness and headaches.  Psychiatric/Behavioral: Negative for depression. The patient is not nervous/anxious.     Objective: Vitals:   05/31/18 0848 05/31/18 0918  BP: (!) 157/88 (!) 152/88  Pulse: 79   Resp: 16   Temp: 97.8 F (36.6 C)   TempSrc: Oral   SpO2: 95%   Weight: 213 lb 6.4 oz (96.8 kg)   Height: 5\' 8"  (1.727 m)    Wt Readings from Last 3 Encounters:  05/31/18 213 lb 6.4 oz (96.8 kg)  09/07/17 216 lb (98 kg)    08/26/17 216 lb 3.2 oz (98.1 kg)    Physical Exam  Constitutional: He is oriented to person, place, and time. He appears well-developed and well-nourished.  HENT:  Head: Normocephalic and atraumatic.  Right Ear: External ear normal.  Left Ear: External ear normal.  Eyes: Conjunctivae and EOM are normal.  Neck: Normal range of motion. Neck supple. No tracheal deviation present. No thyromegaly present.  Cardiovascular: Normal rate, regular rhythm and normal heart sounds.  No murmur heard.  Pulmonary/Chest: Effort normal and breath sounds normal. No stridor. No respiratory distress. He has no wheezes.  Abdominal: Soft. Bowel sounds are normal. He exhibits no distension. There is no tenderness. There is no guarding.  Musculoskeletal: Normal range of motion.  Scar on left knee, oa changes on right  Neurological: He is alert and oriented to person, place, and time.  Skin: Skin is warm. Capillary refill takes less than 2 seconds.  Psychiatric: He has a normal mood and affect. His behavior is normal. Judgment and thought content normal.   Ekg:  Nsr, no changes in serial ekg's from 2017, 2018  No ST elevation No T wave inversion   Assessment and Plan Kristain was seen today for pre-op exam.  Diagnoses and all orders for this visit:  Preoperative clearance- will be able to clear pt once blood pressure improves ekg wnl -     EKG 12-Lead -     POCT CBC -     POCT urinalysis dipstick -     Basic metabolic panel  Uncontrolled hypertension- add lisinopril to the amlodipine to improve blood pressures DASH diet and water Follow up in 4 weeks for bp check Pt should bring his home bp monitor  -     lisinopril (PRINIVIL,ZESTRIL) 10 MG tablet; Take 1 tablet (10 mg total) by mouth daily.   Alcohol use - CAGE negative Discussed limiting drinks to 1 per day    Abundio Teuscher A Creta Levin

## 2018-05-31 NOTE — Patient Instructions (Addendum)
   If you have lab work done today you will be contacted with your lab results within the next 2 weeks.  If you have not heard from us then please contact us. The fastest way to get your results is to register for My Chart.   IF you received an x-ray today, you will receive an invoice from [facility]. Please contact [facility] at [phone] with questions or concerns regarding your invoice.   IF you received labwork today, you will receive an invoice from [facility]. Please contact [facility] at [phone] with questions or concerns regarding your invoice.   Our billing staff will not be able to assist you with questions regarding bills from these companies.  You will be contacted with the lab results as soon as they are available. The fastest way to get your results is to activate your My Chart account. Instructions are located on the last page of this paperwork. If you have not heard from us regarding the results in 2 weeks, please contact this office.     How to Take Your Blood Pressure Blood pressure is a measurement of how strongly your blood is pressing against the walls of your arteries. Arteries are blood vessels that carry blood from your heart throughout your body. Your health care provider takes your blood pressure at each office visit. You can also take your own blood pressure at home with a blood pressure machine. You may need to take your own blood pressure:  To confirm a diagnosis of high blood pressure (hypertension).  To monitor your blood pressure over time.  To make sure your blood pressure medicine is working.  Supplies needed: To take your blood pressure, you will need a blood pressure machine. You can buy a blood pressure machine, or blood pressure monitor, at most drugstores or online. There are several types of home blood pressure monitors. When choosing one, consider the following:  Choose a monitor that has an arm cuff.  Choose a monitor  that wraps snugly around your upper arm. You should be able to fit only one finger between your arm and the cuff.  Do not choose a monitor that measures your blood pressure from your wrist or finger.  Your health care provider can suggest a reliable monitor that will meet your needs. How to prepare To get the most accurate reading, avoid the following for 30 minutes before you check your blood pressure:  Drinking caffeine.  Drinking alcohol.  Eating.  Smoking.  Exercising.  Five minutes before you check your blood pressure:  Empty your bladder.  Sit quietly without talking in a dining chair, rather than in a soft couch or armchair.  How to take your blood pressure To check your blood pressure, follow the instructions in the manual that came with your blood pressure monitor. If you have a digital blood pressure monitor, the instructions may be as follows: 1. Sit up straight. 2. Place your feet on the floor. Do not cross your ankles or legs. 3. Rest your left arm at the level of your heart on a table or desk or on the arm of a chair. 4. Pull up your shirt sleeve. 5. Wrap the blood pressure cuff around the upper part of your left arm, 1 inch (2.5 cm) above your elbow. It is best to wrap the cuff around bare skin. 6. Fit the cuff snugly around your arm. You should be able to place only one finger between the cuff and your arm. 7.   Position the cord inside the groove of your elbow. 8. Press the power button. 9. Sit quietly while the cuff inflates and deflates. 10. Read the digital reading on the monitor screen and write it down (record it). 11. Wait 2-3 minutes, then repeat the steps, starting at step 1.  What does my blood pressure reading mean? A blood pressure reading consists of a higher number over a lower number. Ideally, your blood pressure should be below 120/80. The first ("top") number is called the systolic pressure. It is a measure of the pressure in your arteries as your  heart beats. The second ("bottom") number is called the diastolic pressure. It is a measure of the pressure in your arteries as the heart relaxes. Blood pressure is classified into four stages. The following are the stages for adults who do not have a short-term serious illness or a chronic condition. Systolic pressure and diastolic pressure are measured in a unit called mm Hg. Normal  Systolic pressure: below 120.  Diastolic pressure: below 80. Elevated  Systolic pressure: 120-129.  Diastolic pressure: below 80. Hypertension stage 1  Systolic pressure: 130-139.  Diastolic pressure: 80-89. Hypertension stage 2  Systolic pressure: 140 or above.  Diastolic pressure: 90 or above. You can have prehypertension or hypertension even if only the systolic or only the diastolic number in your reading is higher than normal. Follow these instructions at home:  Check your blood pressure as often as recommended by your health care provider.  Take your monitor to the next appointment with your health care provider to make sure: ? That you are using it correctly. ? That it provides accurate readings.  Be sure you understand what your goal blood pressure numbers are.  Tell your health care provider if you are having any side effects from blood pressure medicine. Contact a health care provider if:  Your blood pressure is consistently high. Get help right away if:  Your systolic blood pressure is higher than 180.  Your diastolic blood pressure is higher than 110. This information is not intended to replace advice given to you by your health care provider. Make sure you discuss any questions you have with your health care provider. Document Released: 01/17/2016 Document Revised: 03/31/2016 Document Reviewed: 01/17/2016 Elsevier Interactive Patient Education  2018 [employer]  

## 2018-06-01 LAB — BASIC METABOLIC PANEL
BUN / CREAT RATIO: 16 (ref 9–20)
BUN: 15 mg/dL (ref 6–24)
CO2: 20 mmol/L (ref 20–29)
Calcium: 10.3 mg/dL — ABNORMAL HIGH (ref 8.7–10.2)
Chloride: 104 mmol/L (ref 96–106)
Creatinine, Ser: 0.91 mg/dL (ref 0.76–1.27)
GFR, EST AFRICAN AMERICAN: 107 mL/min/{1.73_m2} (ref >59)
GFR, EST NON AFRICAN AMERICAN: 93 mL/min/{1.73_m2} (ref >59)
Glucose: 86 mg/dL (ref 65–99)
Potassium: 4 mmol/L (ref 3.5–5.2)
SODIUM: 140 mmol/L (ref 134–144)

## 2018-06-21 ENCOUNTER — Telehealth: Payer: Self-pay | Admitting: Neurology

## 2018-06-21 NOTE — Telephone Encounter (Signed)
Pt requesting a call, stating that he never had the chance to follow for his CPAP back in 2017. Scheduled pt for Dr. Teofilo Pod next opening in January. Pt requesting a call to discuss coming in before the end of the year. Please advise

## 2018-06-22 NOTE — Telephone Encounter (Signed)
I called pt and offered him two appts this afternoon but pt cannot make those appts. I will keep him on my waitlist and call him as appts become available. Pt verbalized understanding.

## 2018-06-25 ENCOUNTER — Other Ambulatory Visit: Payer: Self-pay | Admitting: Emergency Medicine

## 2018-06-25 DIAGNOSIS — E785 Hyperlipidemia, unspecified: Secondary | ICD-10-CM

## 2018-06-27 NOTE — Telephone Encounter (Signed)
I called pt and offered him an appt with Dr. Frances Furbish for 06/30/18 at 10:30am, check in at 10:00am. Pt verbalized understanding of this appt date and time and was agreeable to it.

## 2018-06-30 ENCOUNTER — Ambulatory Visit (INDEPENDENT_AMBULATORY_CARE_PROVIDER_SITE_OTHER): Payer: BLUE CROSS/BLUE SHIELD | Admitting: Neurology

## 2018-06-30 ENCOUNTER — Encounter: Payer: Self-pay | Admitting: Neurology

## 2018-06-30 VITALS — BP 140/80 | HR 76 | Ht 68.0 in | Wt 218.0 lb

## 2018-06-30 DIAGNOSIS — E669 Obesity, unspecified: Secondary | ICD-10-CM

## 2018-06-30 DIAGNOSIS — G4733 Obstructive sleep apnea (adult) (pediatric): Secondary | ICD-10-CM

## 2018-06-30 DIAGNOSIS — G4719 Other hypersomnia: Secondary | ICD-10-CM | POA: Diagnosis not present

## 2018-06-30 DIAGNOSIS — R635 Abnormal weight gain: Secondary | ICD-10-CM

## 2018-06-30 NOTE — Progress Notes (Signed)
Subjective:    Patient ID: Terry Guerra is a 57 y.o. male.  HPI     Interim history:   Terry Guerra is a 57 year old right-handed gentleman with an underlying medical history of hypertension and obesity, who presents for reevaluation of his obstructive sleep apnea after a longer gap of over 2-1/2 years. The patient is unaccompanied today. I first met him on 12/17/2015 at the request of his primary care provider, at which time he reported snoring and daytime somnolence. He was advised to proceed with sleep study. He had a baseline sleep study on 01/07/2016. He had mostly light stage sleep and very little slow-wave sleep and very little REM sleep during the study. Sleep efficiency was 88.9%. Total AHI was 24.5 per hour. Average oxygen saturation was 93%, nadir was 80%. He was advised to proceed with a CPAP titration study. He did not return for this. He was lost to follow-up after that.  Today, 06/30/2018: He reports, he would be willing to seek treatment with CPAP. Of note, he has upcoming knee surgery in December 2019. He will have a partial knee replacement on the right, has had a partial knee replacement on the left in January 2019. He has had more lack of energy, he feels tired, he is not fully rested. He tries to be in bed between 10 and 11 and rise time is 6. He works as an Chief Executive Officer. He has no night to night nocturia and denies morning headaches. His weight has been fluctuating a little bit. Currently about 5 pounds more than at the time of sleep study testing 2-1/2 years ago. He drinks caffeine in the form of coffee, one or 2 cups per day and tea, one or 2 cups per day. He drinks alcohol occasionally in the form of beer, he quit smoking about 20 years ago. He is single, lives alone, no pets, has a girlfriend. He started a new BP med recently.  The patient's allergies, current medications, family history, past medical history, past social history, past surgical history and problem  list were reviewed and updated as appropriate.   Previously:   12/17/2015: (He) reports snoring and excessive daytime somnolence. I reviewed your office note from 11/04/2015.His main complaint about his sleep is loud snoring and lack of energy during the day. He feels like he is not motivated to do anything as he does not have the energy. Blood pressure values have been borderline. He has been on amlodipine and takes it regularly he reports. His Epworth sleepiness score is 5 out of 24 today, his fatigue score is 42 out of 63. Bedtime is around 11 PM. He has had sleep onset difficulties for years and has been taking over-the-counter sleep aid for years, 2-3 pills on an average night. He denies morning headaches but has nocturia, usually 2-3 times per night. Rise time is around 7 AM but he does not wake up rested. Sleep is interrupted. He does not feel like he gets deep sleep. He has woken himself up with his snoring and his girlfriend has noted witnessed apneic pauses while he is asleep and has woken him up because of his apneas. She sometimes has to leave the bedroom and closed the door because of his loud snoring. There is no family history of OSA that he can recall. He does have restless leg symptoms occasionally with the inability to relax his legs at night. He does not always drink enough water. He drinks beer during the week and on weekends,  usually about 12 be appropriate. He quit smoking in 1999. He drinks coffee 1 cup a day and sometimes soda one a day. He lives alone, he has 1 child, he works as a Restaurant manager, fast food. He has tried a prescription sleep aid but did not feel good on it. He has bothersome arthritis in the left knee and has seen orthopedics at [facility]. He is status post knee injections with steroids, and Synvisc injections 3.  His Past Medical History Is Significant For: Past Medical History:  Diagnosis Date  . Arthritis    knees  . Hypertension   . Sleep apnea    no CPAP     His Past Surgical History Is Significant For: Past Surgical History:  Procedure Laterality Date  . COLONOSCOPY    . HERNIA REPAIR    . NASAL SEPTUM SURGERY    . PARTIAL KNEE ARTHROPLASTY Left 09/07/2017   Procedure: UNICOMPARTMENTAL KNEE;  Surgeon: Renette Butters, MD;  Location: [facility];  Service: Orthopedics;  Laterality: Left;    His Family History Is Significant For: Family History  Problem Relation Age of Onset  . Cancer Mother   . Diabetes Father   . Allergies Brother     His Social History Is Significant For: Social History   Socioeconomic History  . Marital status: Divorced    Spouse name: Opal Sidles   . Number of children: 1  . Years of education: 70  . Highest education level: Not on file  Occupational History  . Occupation: Games developer   Social Needs  . Financial resource strain: Not on file  . Food insecurity:    Worry: Not on file    Inability: Not on file  . Transportation needs:    Medical: Not on file    Non-medical: Not on file  Tobacco Use  . Smoking status: Former Research scientist (life sciences)  . Smokeless tobacco: Never Used  . Tobacco comment: Quit 1999  Substance and Sexual Activity  . Alcohol use: Yes    Alcohol/week: 12.0 standard drinks    Types: 12 Standard drinks or equivalent per week  . Drug use: No  . Sexual activity: Yes    Birth control/protection: None  Lifestyle  . Physical activity:    Days per week: Not on file    Minutes per session: Not on file  . Stress: Not on file  Relationships  . Social connections:    Talks on phone: Not on file    Gets together: Not on file    Attends religious service: Not on file    Active member of club or organization: Not on file    Attends meetings of clubs or organizations: Not on file    Relationship status: Not on file  Other Topics Concern  . Not on file  Social History Narrative   Drinks 1 cup of coffee daily     His Allergies Are:  No Known Allergies:   His Current Medications Are:  Outpatient  Encounter Medications as of 06/30/2018  Medication Sig  . aspirin EC 325 MG tablet Take 1 tablet (325 mg total) by mouth daily. For 30 days post op for DVT Prophylaxis  . DUEXIS 800-26.6 MG TABS Take 1 tablet by mouth 3 (three) times daily as needed (PAIN).   Marland Kitchen lisinopril (PRINIVIL,ZESTRIL) 10 MG tablet Take 1 tablet (10 mg total) by mouth daily.  . pravastatin (PRAVACHOL) 40 MG tablet TAKE 1 TABLET BY MOUTH EVERY DAY  . amLODipine (NORVASC) 10 MG tablet Take 1 tablet (  10 mg total) by mouth daily.  . [DISCONTINUED] DiphenhydrAMINE HCl, Sleep, ([facility] SLEEP AID PO) Take 2-3 tablets by mouth at bedtime as needed (SLEEP). Depends on insomnia if takes 2-3 tablets  . [DISCONTINUED] docusate sodium (COLACE) 100 MG capsule Take 1 capsule (100 mg total) by mouth 2 (two) times daily. To prevent constipation while taking pain medication.  . [DISCONTINUED] Menthol, Topical Analgesic, (ICY HOT EX) Apply 1 application topically 3 (three) times daily as needed (PAIN).  . [DISCONTINUED] methocarbamol (ROBAXIN) 500 MG tablet Take 1 tablet (500 mg total) by mouth every 6 (six) hours as needed for muscle spasms.  . [DISCONTINUED] ondansetron (ZOFRAN) 4 MG tablet Take 1 tablet (4 mg total) by mouth every 8 (eight) hours as needed for nausea or vomiting.  . [DISCONTINUED] sodium chloride (OCEAN) 0.65 % nasal spray Place 1 spray into the nose at bedtime as needed for congestion.   No facility-administered encounter medications on file as of 06/30/2018.   :  Review of Systems:  Out of a complete 14 point review of systems, all are reviewed and negative with the exception of these symptoms as listed below: Review of Systems  Neurological:       Pt presents today to discuss his sleep. Pt was unable to do titration study when it was ordered. Pt would like to follow up on getting a cpap.    Objective:  Neurological Exam  Physical Exam Physical Examination:   Vitals:   06/30/18 1033  BP: 140/80  Pulse: 76     General Examination: The patient is a very pleasant 57 y.o. male in no acute distress. He appears well-developed and well-nourished and well groomed.   HEENT: Normocephalic, atraumatic, pupils are equal, round and reactive to light and accommodation. Extraocular tracking is good without limitation to gaze excursion or nystagmus noted. Normal smooth pursuit is noted. Hearing is grossly intact. Face is symmetric with normal facial animation and normal facial sensation. Speech is clear with no dysarthria noted. There is no hypophonia. There is no lip, neck/head, jaw or voice tremor. Neck shows FROM. Oropharynx exam reveals: moderate mouth dryness, adequate dental hygiene and moderate airway crowding, due to thicker soft palate, larger uvula, smaller airway entry, thicker tongue. Mallampati is class II. Tongue protrudes centrally and palate elevates symmetrically. Tonsils are smaller in size. Neck size is 18.25 inches. He has a Mild overbite.   Chest: Clear to auscultation without wheezing, rhonchi or crackles noted.  Heart: S1+S2+0, regular and normal without murmurs, rubs or gallops noted.   Abdomen: Soft, non-tender and non-distended with normal bowel sounds appreciated on auscultation.  Extremities: There is no pitting edema in the distal lower extremities bilaterally.   Skin: Warm and dry without trophic changes noted. There are no varicose veins.  Musculoskeletal: exam reveals: slight discomfort in the left knee, discomfort in the right knee with some decrease in range of motion, also range of motion decrease in both shoulders, left more than right.  Neurologically:  Mental status: The patient is awake, alert and oriented in all 4 spheres. His immediate and remote memory, attention, language skills and fund of knowledge are appropriate. There is no evidence of aphasia, agnosia, apraxia or anomia. Speech is clear with normal prosody and enunciation. Thought process is linear. Mood is  normal and affect is normal.  Cranial nerves II - XII are as described above under HEENT exam. In addition: shoulder shrug is normal with equal shoulder height noted. Motor exam: Normal bulk, strength and tone is  noted. There is no drift, tremor or rebound. Romberg is negative. Reflexes are 2+ throughout. Fine motor skills and coordination: intact with normal finger taps, normal hand movements, normal rapid alternating patting, normal foot taps and normal foot agility.  Cerebellar testing: No dysmetria or intention tremor on finger to nose testing. Heel to shin is unremarkable bilaterally. There is no truncal or gait ataxia.  Sensory exam: intact to light touch, pinprick, vibration, temperature sense  in the upper and lower extremities.  Gait, station and balance: He stands easily. No veering to one side is noted. No leaning to one side is noted. Posture is age-appropriate and stance is narrow based. Gait shows normal stride length and normal pace. No problems turning are noted. Tandem walk is slightly challenging.             Assessment and Plan:  In summary, Terry Guerra is a very pleasant 57 year old male with an underlying medical history of hypertension, knee arthritis and obesity, who Presents for reevaluation of his obstructive sleep apnea which was deemed to be in the moderate range by baseline sleep study testing in May 2017. He has had a little bit of weight fluctuation, weight a little bit higher by 5 pounds at this point. He has had worsening daytime tiredness and lack of energy and would like to pursue CPAP therapy. I would recommend that he come back in for a full night CPAP titration study and we will request this from his insurance for authorization. I explained the CPAP treatment option to him in detail. I explained the importance of treating moderate sleep apnea with respect reducing cardiovascular disease down the Road, including congestive heart failure, difficult to treat  hypertension, cardiac arrhythmias, or stroke. Even type 2 diabetes has, in part, been linked to untreated OSA. Symptoms of untreated OSA include daytime sleepiness, memory problems, mood irritability and mood disorder such as depression and anxiety, lack of energy, as well as recurrent headaches, especially morning headaches. We talked about trying to maintain a healthy lifestyle in general. I recommended the following at this time: sleep study with positive airway pressure titration. I explained the importance of being compliant with PAP treatment, not only for insurance purposes but primarily to improve His symptoms, and for the patient's long term health benefit, including to reduce His cardiovascular risks.  I spent 25 minutes in total face-to-face time with the patient, more than 50% of which was spent in counseling and coordination of care, reviewing test results, reviewing medication and discussing or reviewing the diagnosis of OSA, its prognosis and treatment options. Pertinent laboratory and imaging test results that were available during this visit with the patient were reviewed by me and considered in my medical decision making (see chart for details).

## 2018-06-30 NOTE — Patient Instructions (Signed)
I would like for you to return to the sleep lab for a CPAP titration study, during which we will monitor your sleep and adjust your treatment with the CPAP. I have placed the order in your chart. The sleep lab will call you to set up your sleep study. You have moderate sleep apnea, weight has gone up a little bit.  Please remember that untreated obstructive sleep apnea when it is moderate to severe can have an adverse impact on cardiovascular health and raise her risk for heart disease, arrhythmias, hypertension, congestive heart failure, stroke and diabetes. Untreated obstructive sleep apnea causes sleep disruption, nonrestorative sleep, and sleep deprivation. This can have an impact on your day to day functioning and cause daytime sleepiness and impairment of cognitive function, memory loss, mood disturbance, and problems focussing. Using CPAP regularly can improve these symptoms.

## 2018-07-01 ENCOUNTER — Encounter: Payer: Self-pay | Admitting: Family Medicine

## 2018-07-01 ENCOUNTER — Ambulatory Visit (INDEPENDENT_AMBULATORY_CARE_PROVIDER_SITE_OTHER): Payer: BLUE CROSS/BLUE SHIELD | Admitting: Family Medicine

## 2018-07-01 VITALS — BP 138/76 | HR 80 | Temp 97.7°F | Resp 18 | Ht 68.0 in | Wt 217.8 lb

## 2018-07-01 DIAGNOSIS — I10 Essential (primary) hypertension: Secondary | ICD-10-CM

## 2018-07-01 DIAGNOSIS — Z5181 Encounter for therapeutic drug level monitoring: Secondary | ICD-10-CM

## 2018-07-01 DIAGNOSIS — G4733 Obstructive sleep apnea (adult) (pediatric): Secondary | ICD-10-CM

## 2018-07-01 NOTE — Patient Instructions (Addendum)
Hypertension Hypertension is another name for high blood pressure. High blood pressure forces your heart to work harder to pump blood. This can cause problems over time. There are two numbers in a blood pressure reading. There is a top number (systolic) over a bottom number (diastolic). It is best to have a blood pressure below 120/80. Healthy choices can help lower your blood pressure. You may need medicine to help lower your blood pressure if:  Your blood pressure cannot be lowered with healthy choices.  Your blood pressure is higher than 130/80.  Follow these instructions at home: Eating and drinking  If directed, follow the DASH eating plan. This diet includes: ? Filling half of your plate at each meal with fruits and vegetables. ? Filling one quarter of your plate at each meal with whole grains. Whole grains include whole wheat pasta, brown rice, and whole grain bread. ? Eating or drinking low-fat dairy products, such as skim milk or low-fat yogurt. ? Filling one quarter of your plate at each meal with low-fat (lean) proteins. Low-fat proteins include fish, skinless chicken, eggs, beans, and tofu. ? Avoiding fatty meat, cured and processed meat, or chicken with skin. ? Avoiding premade or processed food.  Eat less than 1,500 mg of salt (sodium) a day.  Limit alcohol use to no more than 1 drink a day for nonpregnant women and 2 drinks a day for men. One drink equals 12 oz of beer, 5 oz of wine, or 1 oz of hard liquor. Lifestyle  Work with your doctor to stay at a healthy weight or to lose weight. Ask your doctor what the best weight is for you.  Get at least 30 minutes of exercise that causes your heart to beat faster (aerobic exercise) most days of the week. This may include walking, swimming, or biking.  Get at least 30 minutes of exercise that strengthens your muscles (resistance exercise) at least 3 days a week. This may include lifting weights or pilates.  Do not use any  products that contain nicotine or tobacco. This includes cigarettes and e-cigarettes. If you need help quitting, ask your doctor.  Check your blood pressure at home as told by your doctor.  Keep all follow-up visits as told by your doctor. This is important. Medicines  Take over-the-counter and prescription medicines only as told by your doctor. Follow directions carefully.  Do not skip doses of blood pressure medicine. The medicine does not work as well if you skip doses. Skipping doses also puts you at risk for problems.  Ask your doctor about side effects or reactions to medicines that you should watch for. Contact a doctor if:  You think you are having a reaction to the medicine you are taking.  You have headaches that keep coming back (recurring).  You feel dizzy.  You have swelling in your ankles.  You have trouble with your vision. Get help right away if:  You get a very bad headache.  You start to feel confused.  You feel weak or numb.  You feel faint.  You get very bad pain in your: ? Chest. ? Belly (abdomen).  You throw up (vomit) more than once.  You have trouble breathing. Summary  Hypertension is another name for high blood pressure.  Making healthy choices can help lower blood pressure. If your blood pressure cannot be controlled with healthy choices, you may need to take medicine. This information is not intended to replace advice given to you by your health care  provider. Make sure you discuss any questions you have with your health care provider. Document Released: 01/27/2008 Document Revised: 07/08/2016 Document Reviewed: 07/08/2016 Elsevier Interactive Patient Education  [employer].  If you have lab work done today you will be contacted with your lab results within the next 2 weeks.  If you have not heard from [place] then please contact us. The fastest way to get your results is to register for My Chart.   IF you received an x-ray today, you  will receive an invoice from [facility]. Please contact [facility] at [phone] with questions or concerns regarding your invoice.   IF you received labwork today, you will receive an invoice from [facility]. Please contact [facility] at [phone] with questions or concerns regarding your invoice.   Our billing staff will not be able to assist you with questions regarding bills from these companies.  You will be contacted with the lab results as soon as they are available. The fastest way to get your results is to activate your My Chart account. Instructions are located on the last page of this paperwork. If you have not heard from [place] regarding the results in 2 weeks, please contact this office.      CPAP and BiPAP Information CPAP and BiPAP are methods of helping a person breathe with the use of air pressure. CPAP stands for "continuous positive airway pressure." BiPAP stands for "bi-level positive airway pressure." In both methods, air is blown through your nose or mouth and into your air passages to help you breathe well. CPAP and BiPAP use different amounts of pressure to blow air. With CPAP, the amount of pressure stays the same while you breathe in and out. With BiPAP, the amount of pressure is increased when you breathe in (inhale) so that you can take larger breaths. Your health care provider will recommend whether CPAP or BiPAP would be more helpful for you. Why are CPAP and BiPAP treatments used? CPAP or BiPAP can be helpful if you have:  Sleep apnea.  Chronic obstructive pulmonary disease (COPD).  Heart failure.  Medical conditions that weaken the muscles of the chest including muscular dystrophy, or neurological diseases such as amyotrophic lateral sclerosis (ALS).  Other problems that cause breathing to be weak, abnormal, or difficult.  CPAP is most commonly used for obstructive sleep apnea (OSA) to keep the airways from collapsing when the muscles relax  during sleep. How is CPAP or BiPAP administered? Both CPAP and BiPAP are provided by a small machine with a flexible plastic tube that attaches to a plastic mask. You wear the mask. Air is blown through the mask into your nose or mouth. The amount of pressure that is used to blow the air can be adjusted on the machine. Your health care provider will determine the pressure setting that should be used based on your individual needs. When should CPAP or BiPAP be used? In most cases, the mask only needs to be worn during sleep. Generally, the mask needs to be worn throughout the night and during any daytime naps. People with certain medical conditions may also need to wear the mask at other times when they are awake. Follow instructions from your health care provider about when to use the machine. What are some tips for using the mask?  Because the mask needs to be snug, some people feel trapped or closed-in (claustrophobic) when first using the mask. If you feel this way, you may need to get used to the  mask. One way to do this is by holding the mask loosely over your nose or mouth and then gradually applying the mask more snugly. You can also gradually increase the amount of time that you use the mask.  Masks are available in various types and sizes. Some fit over your mouth and nose while others fit over just your nose. If your mask does not fit well, talk with your health care provider about getting a different one.  If you are using a mask that fits over your nose and you tend to breathe through your mouth, a chin strap may be applied to help keep your mouth closed.  The CPAP and BiPAP machines have alarms that may sound if the mask comes off or develops a leak.  If you have trouble with the mask, it is very important that you talk with your health care provider about finding a way to make the mask easier to tolerate. Do not stop using the mask. Stopping the use of the mask could have a negative  impact on your health. What are some tips for using the machine?  Place your CPAP or BiPAP machine on a secure table or stand near an electrical outlet.  Know where the on/off switch is located on the machine.  Follow instructions from your health care provider about how to set the pressure on your machine and when you should use it.  Do not eat or drink while the CPAP or BiPAP machine is on. Food or fluids could get pushed into your lungs by the pressure of the CPAP or BiPAP.  Do not smoke. Tobacco smoke residue can damage the machine.  For home use, CPAP and BiPAP machines can be rented or purchased through home health care companies. Many different brands of machines are available. Renting a machine before purchasing may help you find out which particular machine works well for you.  Keep the CPAP or BiPAP machine and attachments clean. Ask your health care provider for specific instructions. Get help right away if:  You have redness or open areas around your nose or mouth where the mask fits.  You have trouble using the CPAP or BiPAP machine.  You cannot tolerate wearing the CPAP or BiPAP mask.  You have pain, discomfort, and bloating in your abdomen. Summary  CPAP and BiPAP are methods of helping a person breathe with the use of air pressure.  Both CPAP and BiPAP are provided by a small machine with a flexible plastic tube that attaches to a plastic mask.  If you have trouble with the mask, it is very important that you talk with your health care provider about finding a way to make the mask easier to tolerate. This information is not intended to replace advice given to you by your health care provider. Make sure you discuss any questions you have with your health care provider. Document Released: 05/08/2004 Document Revised: 06/29/2016 Document Reviewed: 06/29/2016 Elsevier Interactive Patient Education  2017 [employer].

## 2018-07-01 NOTE — Progress Notes (Signed)
Chief Complaint  Patient presents with  . Hypertension    4 week recheck on his BP; takes meds every day with no side effects    HPI  Hypertension: Patient here for follow-up of elevated blood pressure. He is not exercising and is adherent to low salt diet.  Blood pressure is well controlled at home. Cardiac symptoms none. Patient denies chest pain, chest pressure/discomfort, claudication, dyspnea, exertional chest pressure/discomfort, fatigue and irregular heart beat.  Cardiovascular risk factors: advanced age (older than 44 for men, 9 for women), hypertension and male gender. Use of agents associated with hypertension: NSAIDS. History of target organ damage: none. BP Readings from Last 3 Encounters:  07/01/18 (!) 155/77  06/30/18 140/80  05/31/18 (!) 152/88   Moderate OSA He reports that he had evaluation with Dr. Frances Furbish for moderate OSA From her note she is recommending repeat CPAP titration and are awaiting the insurance authorization Denies nocturia He drinks alcohol moderately He wakes up tired almost daily  Past Medical History:  Diagnosis Date  . Arthritis    knees  . Hypertension   . Sleep apnea    no CPAP    Current Outpatient Medications  Medication Sig Dispense Refill  . amLODipine (NORVASC) 10 MG tablet Take 10 mg by mouth daily.  3  . aspirin EC 325 MG tablet Take 1 tablet (325 mg total) by mouth daily. For 30 days post op for DVT Prophylaxis 30 tablet 0  . DUEXIS 800-26.6 MG TABS Take 1 tablet by mouth 3 (three) times daily as needed (PAIN).   2  . lisinopril (PRINIVIL,ZESTRIL) 10 MG tablet Take 1 tablet (10 mg total) by mouth daily. 90 tablet 3  . pravastatin (PRAVACHOL) 40 MG tablet TAKE 1 TABLET BY MOUTH EVERY DAY 90 tablet 1   No current facility-administered medications for this visit.     Allergies: No Known Allergies  Past Surgical History:  Procedure Laterality Date  . COLONOSCOPY    . HERNIA REPAIR    . NASAL SEPTUM SURGERY    . PARTIAL KNEE  ARTHROPLASTY Left 09/07/2017   Procedure: UNICOMPARTMENTAL KNEE;  Surgeon: Sheral Apley, MD;  Location: [facility] OR;  Service: Orthopedics;  Laterality: Left;    Social History   Socioeconomic History  . Marital status: Divorced    Spouse name: Erskine Squibb   . Number of children: 1  . Years of education: 29  . Highest education level: Not on file  Occupational History  . Occupation: Music therapist   Social Needs  . Financial resource strain: Not on file  . Food insecurity:    Worry: Not on file    Inability: Not on file  . Transportation needs:    Medical: Not on file    Non-medical: Not on file  Tobacco Use  . Smoking status: Former Games developer  . Smokeless tobacco: Never Used  . Tobacco comment: Quit 1999  Substance and Sexual Activity  . Alcohol use: Yes    Alcohol/week: 12.0 standard drinks    Types: 12 Standard drinks or equivalent per week  . Drug use: No  . Sexual activity: Yes    Birth control/protection: None  Lifestyle  . Physical activity:    Days per week: Not on file    Minutes per session: Not on file  . Stress: Not on file  Relationships  . Social connections:    Talks on phone: Not on file    Gets together: Not on file    Attends religious service: Not on  file    Active member of club or organization: Not on file    Attends meetings of clubs or organizations: Not on file    Relationship status: Not on file  Other Topics Concern  . Not on file  Social History Narrative   Drinks 1 cup of coffee daily     Family History  Problem Relation Age of Onset  . Cancer Mother   . Diabetes Father   . Allergies Brother      ROS Review of Systems See HPI Constitution: No fevers or chills No malaise No diaphoresis Skin: No rash or itching Eyes: no blurry vision, no double vision GU: no dysuria or hematuria Neuro: no dizziness or headaches all others reviewed and negative   Objective: Vitals:   07/01/18 1020  BP: (!) 155/77  Pulse: 80  Resp: 18  Temp: 97.7 F  (36.5 C)  TempSrc: Oral  SpO2: 94%  Weight: 217 lb 12.8 oz (98.8 kg)  Height: 5\' 8"  (1.727 m)    Physical Exam Physical Exam  Constitutional: She is oriented to person, place, and time. She appears well-developed and well-nourished.  HENT:  Head: Normocephalic and atraumatic.  Eyes: Conjunctivae and EOM are normal.  Cardiovascular: Normal rate, regular rhythm and normal heart sounds.   Pulmonary/Chest: Effort normal and breath sounds normal. No respiratory distress. She has no wheezes.  Neurological: She is alert and oriented to person, place, and time.    Assessment and Plan Arham was seen today for hypertension.  Diagnoses and all orders for this visit:  Essential hypertension- BP now at goal, cpm BP Readings from Last 3 Encounters:  07/01/18 138/76  06/30/18 140/80  05/31/18 (!) 152/88    -     Basic metabolic panel  OSA (obstructive sleep apnea)- discussed limiting alcohol consumption  Encounter for medication monitoring -     Basic metabolic panel     Hettie Roselli A Katelynn Heidler

## 2018-07-02 LAB — BASIC METABOLIC PANEL
BUN / CREAT RATIO: 13 (ref 9–20)
BUN: 12 mg/dL (ref 6–24)
CO2: 23 mmol/L (ref 20–29)
CREATININE: 0.93 mg/dL (ref 0.76–1.27)
Calcium: 10.2 mg/dL (ref 8.7–10.2)
Chloride: 103 mmol/L (ref 96–106)
GFR calc Af Amer: 104 mL/min/{1.73_m2} (ref >59)
GFR calc non Af Amer: 90 mL/min/{1.73_m2} (ref >59)
Glucose: 103 mg/dL — ABNORMAL HIGH (ref 65–99)
POTASSIUM: 4.4 mmol/L (ref 3.5–5.2)
SODIUM: 139 mmol/L (ref 134–144)

## 2018-07-04 DIAGNOSIS — M19012 Primary osteoarthritis, left shoulder: Secondary | ICD-10-CM | POA: Diagnosis not present

## 2018-07-04 DIAGNOSIS — M1711 Unilateral primary osteoarthritis, right knee: Secondary | ICD-10-CM | POA: Diagnosis not present

## 2018-07-04 NOTE — H&P (Signed)
KNEE ARTHROPLASTY ADMISSION H&P  Patient ID: Terry Guerra MRN: [id number] DOB/AGE: 1961-02-22 57 y.o.  Chief Complaint: right knee pain.  Planned Procedure Date: 07/26/2018 Medical and cardiac clearance by Dr. Shelly Flatten     HPI: Terry Guerra is a 57 y.o. male with a history of HTN and OSA-no CPAP currently who presents for evaluation of OA RIGHT KNEE. The patient has a history of pain and functional disability in the right knee due to arthritis and has failed non-surgical conservative treatments for greater than 12 weeks to include NSAID's and/or analgesics, corticosteriod injections, viscosupplementation injections and activity modification.  Onset of symptoms was gradual, starting 3 years ago with gradually worsening course since that time.  Patient currently rates pain at 7 out of 10 with activity. Patient has night pain, worsening of pain with activity and weight bearing and pain that interferes with activities of daily living.  Patient has evidence of periarticular osteophytes and joint space narrowing by imaging studies.  There is no active infection.  Past Medical History:  Diagnosis Date  . Arthritis    knees  . Hypertension   . Sleep apnea    no CPAP   Past Surgical History:  Procedure Laterality Date  . COLONOSCOPY    . HERNIA REPAIR    . NASAL SEPTUM SURGERY    . PARTIAL KNEE ARTHROPLASTY Left 09/07/2017   Procedure: UNICOMPARTMENTAL KNEE;  Surgeon: Sheral Apley, MD;  Location: [facility] OR;  Service: Orthopedics;  Laterality: Left;   No Known Allergies   Prior to Admission medications   Medication Sig Start Date End Date Taking? Authorizing Provider  amLODipine (NORVASC) 10 MG tablet Take 10 mg by mouth daily. 04/26/18   [provider]  aspirin EC 325 MG tablet Take 1 tablet (325 mg total) by mouth daily. For 30 days post op for DVT Prophylaxis 09/07/17   Albina Billet III, PA-C  DUEXIS 800-26.6 MG TABS Take 1 tablet by mouth 3 (three) times daily as  needed (PAIN).  08/05/17   [provider]  lisinopril (PRINIVIL,ZESTRIL) 10 MG tablet Take 1 tablet (10 mg total) by mouth daily. 05/31/18   Doristine Bosworth, MD  pravastatin (PRAVACHOL) 40 MG tablet TAKE 1 TABLET BY MOUTH EVERY DAY 06/27/18   Doristine Bosworth, MD   Social history:  Divorced Music therapist. Former smoker. 2 packs/day x 15 years. Quit 19 years ago. 6-8 beers 2-3 times per week.   Family History  Problem Relation Age of Onset  . Cancer Mother   . Diabetes Father   . Allergies Brother     ROS: Currently denies lightheadedness, dizziness, Fever, chills, CP, SOB. No personal history of DVT, PE, MI, or CVA. No loose teeth or dentures.   All other systems have been reviewed and were otherwise currently negative with the exception of those mentioned in the HPI and as above.  Objective: Vitals: Ht: 5 feet 9 inches wt: 219 temp: 97.6 BP: 170/93 pulse: 85 O2 94 % on room air.   Physical Exam: General: Alert, NAD.  Antalgic Gait  HEENT: EOMI, Good Neck Extension  Pulm: No increased work of breathing.  Clear B/L A/P w/o crackle or wheeze.  CV: RRR, No m/g/r appreciated  GI: soft, NT, ND Neuro: Neuro without gross focal deficit.  Sensation intact distally Skin: No lesions in the area of chief complaint MSK/Surgical Site: Right knee w/o redness or effusion.  Medial JLT. ROM 2-100.  5/5 strength in extension and flexion.  +EHL/FHL.  NVI.  Stable varus and valgus stress.    Imaging Review Plain radiographs demonstrate severe degenerative joint disease of the right knee.  Good medial opening and preserved lateral space on stress views.  3 view left shoulder x-rays today show severe glenohumeral arthritis with large inferior osteophyte and joint space narrowing.  Preoperative templating of the joint replacement has been completed, documented, and submitted to the Operating Room personnel in order to optimize intra-operative equipment management.  Assessment: OA RIGHT  KNEE Principal Problem:   Primary osteoarthritis of right knee Active Problems:   HTN (hypertension)   Dyslipidemia   OSA (obstructive sleep apnea)   Plan: Plan for Procedure(s): UNICOMPARTMENTAL RIGHT KNEE  The patient history, physical exam, clinical judgement of the provider and imaging are consistent with end stage degenerative joint disease and unicompartmental joint arthroplasty is deemed medically necessary. The treatment options including medical management, injection therapy, and arthroplasty were discussed at length. The risks and benefits of Procedure(s): UNICOMPARTMENTAL RIGHT KNEE were presented and reviewed.  The risks of nonoperative treatment, versus surgical intervention including but not limited to continued pain, aseptic loosening, stiffness, dislocation/subluxation, infection, bleeding, nerve injury, blood clots, cardiopulmonary complications, morbidity, mortality, among others were discussed. The patient verbalizes understanding and wishes to proceed with the plan.  Patient is being admitted for inpatient treatment for surgery, pain control, PT, OT, prophylactic antibiotics, VTE prophylaxis, progressive ambulation, ADL's and discharge planning.   Dental prophylaxis discussed and recommended for 2 years postoperatively.   The patient does meet the criteria for TXA which will be used perioperatively.    ASA 81 mg BID will be used postoperatively for DVT prophylaxis in addition to SCDs, and early ambulation.  Plan for Tylenol, oxycodone, Robaxin for pain/spasm.  He will continue Duexis-Rx refilled today.  He did not elect for CPM for this or his previous surgery.  The patient is planning to be discharged home in care of his family with Outpatient PT starting POD2 or POD3.  He has severe left shoulder OA as well as bursitis/biceps tendinitis.  SA/GH injection in the office today.   Patient's anticipated LOS is less than 2 midnights, meeting these requirements: -  Younger than 33 - Lives within 1 hour of care - Has a competent adult at home to recover with post-op recover - NO history of  - Chronic pain requiring opiods  - Diabetes  - Coronary Artery Disease  - Heart failure  - Heart attack  - Stroke  - DVT/VTE  - Cardiac arrhythmia  - Respiratory Failure/COPD  - Renal failure  - Anemia  - Advanced Liver disease   Albina Billet III, PA-C 07/04/2018 8:29 AM

## 2018-07-13 ENCOUNTER — Telehealth: Payer: Self-pay | Admitting: Neurology

## 2018-07-13 NOTE — Telephone Encounter (Signed)
Pt has called, he would like to know if since his office visit are there any updates re: a sleep study for him from his insurance.

## 2018-07-15 NOTE — Progress Notes (Signed)
Terry Guerra Terry Guerra Terry Guerra 07-01-18 epic   EKG 05-31-18 epic   BMP 07-01-18 epic

## 2018-07-15 NOTE — Patient Instructions (Addendum)
Terry Guerra  07/15/2018   Your procedure is scheduled on: 07-26-18   [facility]     Report to admitting at 10:00AM    Call this number if you have problems the morning of surgery [phone]     Remember: Do not eat food or drink liquids :After Midnight. BRUSH YOUR TEETH MORNING OF SURGERY AND RINSE YOUR MOUTH OUT, NO CHEWING GUM CANDY OR MINTS.     Take these medicines the morning of surgery with A SIP OF WATER: AMLODIPINE, pravastatin                                You may not have any metal on your body including hair pins and              piercings  Do not wear jewelry, make-up, lotions, powders or perfumes, deodorant              Men may shave face and neck.   Do not bring valuables to the hospital. [name] IS NOT             RESPONSIBLE   FOR VALUABLES.  Contacts, dentures or bridgework may not be worn into surgery.  Leave suitcase in the car. After surgery it may be brought to your room.                  Please read over the following fact sheets you were given: _____________________________________________________________________             [facility] - Preparing [facility] Before surgery, you can play an important role.  Because skin is not sterile, your skin needs to be as free of germs as possible.  You can reduce the number of germs on your skin by washing with CHG (chlorahexidine gluconate) soap before surgery.  CHG is an antiseptic cleaner which kills germs and bonds with the skin to continue killing germs even after washing. Please DO NOT use if you have an allergy to CHG or antibacterial soaps.  If your skin becomes reddened/irritated stop using the CHG and inform your nurse when you arrive at Short Stay. Do not shave (including legs and underarms) for at least 48 hours prior to the first CHG shower.  You may shave your face/neck. Please follow these instructions carefully:  1.  Shower with CHG Soap  the night before surgery and the  morning of Surgery.  2.  If you choose to wash your hair, wash your hair first as usual with your  normal  shampoo.  3.  After you shampoo, rinse your hair and body thoroughly to remove the  shampoo.                           4.  Use CHG as you would any other liquid soap.  You can apply chg directly  to the skin and wash                       Gently with a scrungie or clean washcloth.  5.  Apply the CHG Soap to your body ONLY FROM THE NECK DOWN.   Do not use on face/ open  Wound or open sores. Avoid contact with eyes, ears mouth and genitals (private parts).                       Wash face,  Genitals (private parts) with your normal soap.             6.  Wash thoroughly, paying special attention to the area where your surgery  will be performed.  7.  Thoroughly rinse your body with warm water from the neck down.  8.  DO NOT shower/wash with your normal soap after using and rinsing off  the CHG Soap.                9.  Pat yourself dry with a clean towel.            10.  Wear clean pajamas.            11.  Place clean sheets on your bed the night of your first shower and do not  sleep with pets. Day of Surgery : Do not apply any lotions/deodorants the morning of surgery.  Please wear clean clothes to the hospital/surgery center.  FAILURE TO FOLLOW THESE INSTRUCTIONS MAY RESULT IN THE CANCELLATION OF YOUR SURGERY PATIENT SIGNATURE_________________________________  NURSE SIGNATURE__________________________________  ________________________________________________________________________   Adam Phenix  An incentive spirometer is a tool that can help keep your lungs clear and active. This tool measures how well you are filling your lungs with each breath. Taking long deep breaths may help reverse or decrease the chance of developing breathing (pulmonary) problems (especially infection) following:  A long period of time when  you are unable to move or be active. BEFORE THE PROCEDURE   If the spirometer includes an indicator to show your best effort, your nurse or respiratory therapist will set it to a desired goal.  If possible, sit up straight or lean slightly forward. Try not to slouch.  Hold the incentive spirometer in an upright position. INSTRUCTIONS FOR USE  1. Sit on the edge of your bed if possible, or sit up as far as you can in bed or on a chair. 2. Hold the incentive spirometer in an upright position. 3. Breathe out normally. 4. Place the mouthpiece in your mouth and seal your lips tightly around it. 5. Breathe in slowly and as deeply as possible, raising the piston or the ball toward the top of the column. 6. Hold your breath for 3-5 seconds or for as long as possible. Allow the piston or ball to fall to the bottom of the column. 7. Remove the mouthpiece from your mouth and breathe out normally. 8. Rest for a few seconds and repeat Steps 1 through 7 at least 10 times every 1-2 hours when you are awake. Take your time and take a few normal breaths between deep breaths. 9. The spirometer may include an indicator to show your best effort. Use the indicator as a goal to work toward during each repetition. 10. After each set of 10 deep breaths, practice coughing to be sure your lungs are clear. If you have an incision (the cut made at the time of surgery), support your incision when coughing by placing a pillow or rolled up towels firmly against it. Once you are able to get out of bed, walk around indoors and cough well. You may stop using the incentive spirometer when instructed by your caregiver.  RISKS AND COMPLICATIONS  Take your time so you do not get  dizzy or light-headed.  If you are in pain, you may need to take or ask for pain medication before doing incentive spirometry. It is harder to take a deep breath if you are having pain. AFTER USE  Rest and breathe slowly and easily.  It can be  helpful to keep track of a log of your progress. Your caregiver can provide you with a simple table to help with this. If you are using the spirometer at home, follow these instructions: SEEK MEDICAL CARE IF:   You are having difficultly using the spirometer.  You have trouble using the spirometer as often as instructed.  Your pain medication is not giving enough relief while using the spirometer.  You develop fever of 100.5 F (38.1 C) or higher. SEEK IMMEDIATE MEDICAL CARE IF:   You cough up bloody sputum that had not been present before.  You develop fever of 102 F (38.9 C) or greater.  You develop worsening pain at or near the incision site. MAKE SURE YOU:   Understand these instructions.  Will watch your condition.  Will get help right away if you are not doing well or get worse. Document Released: 12/21/2006 Document Revised: 11/02/2011 Document Reviewed: 02/21/2007 [facility] Patient Information 2014 [place], [place].   ________________________________________________________________________

## 2018-07-18 ENCOUNTER — Telehealth: Payer: Self-pay

## 2018-07-18 DIAGNOSIS — G4733 Obstructive sleep apnea (adult) (pediatric): Secondary | ICD-10-CM

## 2018-07-18 NOTE — Telephone Encounter (Signed)
[employer] denied cpap titration. Need an auto cpap ordered.

## 2018-07-19 ENCOUNTER — Encounter (HOSPITAL_COMMUNITY)
Admission: RE | Admit: 2018-07-19 | Discharge: 2018-07-19 | Disposition: A | Discharge status: Home / Self Care | Payer: BLUE CROSS/BLUE SHIELD | Source: Ambulatory Visit | Attending: Orthopedic Surgery | Admitting: Orthopedic Surgery

## 2018-07-19 ENCOUNTER — Other Ambulatory Visit: Payer: Self-pay

## 2018-07-19 ENCOUNTER — Encounter (HOSPITAL_COMMUNITY): Payer: Self-pay

## 2018-07-19 DIAGNOSIS — Z01812 Encounter for preprocedural laboratory examination: Secondary | ICD-10-CM | POA: Diagnosis not present

## 2018-07-19 HISTORY — DX: Abrasion of left shoulder, initial encounter: S40.212A

## 2018-07-19 HISTORY — DX: Pain in left shoulder: M25.512

## 2018-07-19 LAB — CBC
HEMATOCRIT: 41.9 % (ref 39.0–52.0)
Hemoglobin: 14.3 g/dL (ref 13.0–17.0)
MCH: 30.2 pg (ref 26.0–34.0)
MCHC: 34.1 g/dL (ref 30.0–36.0)
MCV: 88.4 fL (ref 80.0–100.0)
Platelets: 286 10*3/uL (ref 150–400)
RBC: 4.74 MIL/uL (ref 4.22–5.81)
RDW: 12.2 % (ref 11.5–15.5)
WBC: 5.5 10*3/uL (ref 4.0–10.5)
nRBC: 0 % (ref 0.0–0.2)

## 2018-07-19 LAB — SURGICAL PCR SCREEN
MRSA, PCR: NEGATIVE
Staphylococcus aureus: POSITIVE — AB

## 2018-07-19 NOTE — Telephone Encounter (Signed)
Still waiting on auth from insurance. Will call pt as soon as we get approval.

## 2018-07-19 NOTE — Telephone Encounter (Signed)
Received VO from Dr. Frances FurbishAthar to order auto pap 7-15 cm H2O and do ONO on auto pap after one week of auto pap use. Order placed.  I called pt. I advised pt that his cpap titration study was denied by [facility] [employer] [facility]. Dr. Frances FurbishAthar recommends that pt start an auto pap at home and complete an ONO on autopap one week after starting auto pap. I reviewed PAP compliance expectations with the pt. Pt is agreeable to starting an auto-PAP. I advised pt that an order will be sent to a DME, [facility], and [facility] will call the pt within about one week after they file with the pt's insurance. [facility] will show the pt how to use the machine, fit for masks, and troubleshoot the auto-PAP if needed. A follow up appt was made for insurance purposes with Eber Jonesarolyn, NP on 10/17/18 at 9:45am. Pt verbalized understanding to arrive 15 minutes early and bring their auto-PAP. A letter with all of this information in it will be mailed to the pt as a reminder. I verified with the pt that the address we have on file is correct. Pt verbalized understanding of results. Pt had no questions at this time but was encouraged to call back if questions arise. I have sent the ordersto [facility] and have received confirmation that they have received the order.

## 2018-07-25 MED ORDER — BUPIVACAINE LIPOSOME 1.3 % IJ SUSP
20.0000 mL | INTRAMUSCULAR | Status: DC
  Filled 2018-07-25: qty 20

## 2018-07-26 ENCOUNTER — Encounter (HOSPITAL_COMMUNITY)
Admission: RE | Disposition: A | Discharge status: Home / Self Care | Payer: Self-pay | Source: Other Acute Inpatient Hospital | Attending: Orthopedic Surgery

## 2018-07-26 ENCOUNTER — Ambulatory Visit (HOSPITAL_COMMUNITY): Payer: BLUE CROSS/BLUE SHIELD | Admitting: Anesthesiology

## 2018-07-26 ENCOUNTER — Other Ambulatory Visit: Payer: Self-pay

## 2018-07-26 ENCOUNTER — Observation Stay (HOSPITAL_COMMUNITY): Payer: BLUE CROSS/BLUE SHIELD

## 2018-07-26 ENCOUNTER — Observation Stay (HOSPITAL_COMMUNITY)
Admission: RE | Admit: 2018-07-26 | Discharge: 2018-07-27 | Disposition: A | Discharge status: Home / Self Care | Payer: BLUE CROSS/BLUE SHIELD | Source: Other Acute Inpatient Hospital | Attending: Orthopedic Surgery | Admitting: Orthopedic Surgery

## 2018-07-26 ENCOUNTER — Encounter (HOSPITAL_COMMUNITY): Payer: Self-pay | Admitting: Anesthesiology

## 2018-07-26 DIAGNOSIS — I10 Essential (primary) hypertension: Secondary | ICD-10-CM | POA: Diagnosis not present

## 2018-07-26 DIAGNOSIS — Z87891 Personal history of nicotine dependence: Secondary | ICD-10-CM | POA: Diagnosis not present

## 2018-07-26 DIAGNOSIS — M1711 Unilateral primary osteoarthritis, right knee: Principal | ICD-10-CM | POA: Diagnosis present

## 2018-07-26 DIAGNOSIS — E785 Hyperlipidemia, unspecified: Secondary | ICD-10-CM | POA: Diagnosis present

## 2018-07-26 DIAGNOSIS — Z79899 Other long term (current) drug therapy: Secondary | ICD-10-CM | POA: Diagnosis not present

## 2018-07-26 DIAGNOSIS — Z471 Aftercare following joint replacement surgery: Secondary | ICD-10-CM | POA: Diagnosis not present

## 2018-07-26 DIAGNOSIS — G8918 Other acute postprocedural pain: Secondary | ICD-10-CM | POA: Diagnosis not present

## 2018-07-26 DIAGNOSIS — Z96659 Presence of unspecified artificial knee joint: Secondary | ICD-10-CM

## 2018-07-26 DIAGNOSIS — G4733 Obstructive sleep apnea (adult) (pediatric): Secondary | ICD-10-CM | POA: Diagnosis present

## 2018-07-26 DIAGNOSIS — Z7982 Long term (current) use of aspirin: Secondary | ICD-10-CM | POA: Diagnosis not present

## 2018-07-26 DIAGNOSIS — Z96651 Presence of right artificial knee joint: Secondary | ICD-10-CM | POA: Diagnosis not present

## 2018-07-26 DIAGNOSIS — M1710 Unilateral primary osteoarthritis, unspecified knee: Secondary | ICD-10-CM | POA: Diagnosis present

## 2018-07-26 HISTORY — PX: PARTIAL KNEE ARTHROPLASTY: SHX2174

## 2018-07-26 SURGERY — ARTHROPLASTY, KNEE, UNICOMPARTMENTAL
Anesthesia: Regional | Site: Knee | Laterality: Right

## 2018-07-26 MED ORDER — CHLORHEXIDINE GLUCONATE 4 % EX LIQD: 60.0000 mL | Freq: Once | CUTANEOUS | Status: DC

## 2018-07-26 MED ORDER — OXYCODONE HCL 5 MG PO TABS: 5.0000 mg | ORAL_TABLET | ORAL | 0 refills | Status: AC | PRN

## 2018-07-26 MED ORDER — LACTATED RINGERS IV SOLN
INTRAVENOUS | Status: DC
  Administered 2018-07-26 – 2018-07-27 (×2): via INTRAVENOUS

## 2018-07-26 MED ORDER — CEFAZOLIN SODIUM-DEXTROSE 2-4 GM/100ML-% IV SOLN
2.0000 g | INTRAVENOUS | Status: AC
  Administered 2018-07-26: 2 g via INTRAVENOUS
  Filled 2018-07-26: qty 100

## 2018-07-26 MED ORDER — ONDANSETRON HCL 4 MG/2ML IJ SOLN
INTRAMUSCULAR | Status: DC | PRN
  Administered 2018-07-26: 4 mg via INTRAVENOUS

## 2018-07-26 MED ORDER — DIPHENHYDRAMINE HCL 12.5 MG/5ML PO ELIX: 12.5000 mg | ORAL_SOLUTION | ORAL | Status: DC | PRN

## 2018-07-26 MED ORDER — METHOCARBAMOL 500 MG IVPB - SIMPLE MED
500.0000 mg | Freq: Four times a day (QID) | INTRAVENOUS | Status: DC | PRN
  Filled 2018-07-26: qty 50

## 2018-07-26 MED ORDER — KETOROLAC TROMETHAMINE 15 MG/ML IJ SOLN
15.0000 mg | Freq: Four times a day (QID) | INTRAMUSCULAR | Status: DC
  Administered 2018-07-26 – 2018-07-27 (×3): 15 mg via INTRAVENOUS
  Filled 2018-07-26 (×3): qty 1

## 2018-07-26 MED ORDER — SODIUM CHLORIDE 0.9 % IV SOLN
INTRAVENOUS | Status: DC | PRN
  Administered 2018-07-26: 50 mL

## 2018-07-26 MED ORDER — POVIDONE-IODINE 10 % EX SWAB
2.0000 "application " | Freq: Once | CUTANEOUS | Status: AC
  Administered 2018-07-26: 2 via TOPICAL

## 2018-07-26 MED ORDER — GABAPENTIN 300 MG PO CAPS: 300.0000 mg | ORAL_CAPSULE | Freq: Two times a day (BID) | ORAL | 0 refills | Status: DC

## 2018-07-26 MED ORDER — TRANEXAMIC ACID-NACL 1000-0.7 MG/100ML-% IV SOLN
1000.0000 mg | INTRAVENOUS | Status: AC
  Administered 2018-07-26: 1000 mg via INTRAVENOUS
  Filled 2018-07-26: qty 100

## 2018-07-26 MED ORDER — LACTATED RINGERS IV SOLN
INTRAVENOUS | Status: DC
  Administered 2018-07-26 (×2): via INTRAVENOUS

## 2018-07-26 MED ORDER — METHOCARBAMOL 500 MG PO TABS
500.0000 mg | ORAL_TABLET | Freq: Four times a day (QID) | ORAL | Status: DC | PRN
  Administered 2018-07-26 – 2018-07-27 (×3): 500 mg via ORAL
  Filled 2018-07-26 (×3): qty 1

## 2018-07-26 MED ORDER — HYDROMORPHONE HCL 1 MG/ML IJ SOLN: 0.5000 mg | INTRAMUSCULAR | Status: DC | PRN

## 2018-07-26 MED ORDER — PROPOFOL 10 MG/ML IV BOLUS
INTRAVENOUS | Status: DC | PRN
  Administered 2018-07-26: 20 mg via INTRAVENOUS
  Administered 2018-07-26: 10 mg via INTRAVENOUS

## 2018-07-26 MED ORDER — STERILE WATER FOR IRRIGATION IR SOLN
Status: DC | PRN
  Administered 2018-07-26: 1000 mL

## 2018-07-26 MED ORDER — FENTANYL CITRATE (PF) 100 MCG/2ML IJ SOLN: 25.0000 ug | INTRAMUSCULAR | Status: DC | PRN

## 2018-07-26 MED ORDER — PROPOFOL 10 MG/ML IV BOLUS
INTRAVENOUS | Status: AC
  Filled 2018-07-26: qty 60

## 2018-07-26 MED ORDER — ACETAMINOPHEN 500 MG PO TABS: 1000.0000 mg | ORAL_TABLET | Freq: Three times a day (TID) | ORAL | 0 refills | Status: AC

## 2018-07-26 MED ORDER — METHOCARBAMOL 500 MG PO TABS: 500.0000 mg | ORAL_TABLET | Freq: Three times a day (TID) | ORAL | 0 refills | Status: DC | PRN

## 2018-07-26 MED ORDER — DOCUSATE SODIUM 100 MG PO CAPS
100.0000 mg | ORAL_CAPSULE | Freq: Two times a day (BID) | ORAL | Status: DC
  Administered 2018-07-26 – 2018-07-27 (×2): 100 mg via ORAL
  Filled 2018-07-26 (×2): qty 1

## 2018-07-26 MED ORDER — ACETAMINOPHEN 325 MG PO TABS: 325.0000 mg | ORAL_TABLET | Freq: Four times a day (QID) | ORAL | Status: DC | PRN

## 2018-07-26 MED ORDER — METOCLOPRAMIDE HCL 5 MG/ML IJ SOLN: 5.0000 mg | Freq: Three times a day (TID) | INTRAMUSCULAR | Status: DC | PRN

## 2018-07-26 MED ORDER — PROPOFOL 500 MG/50ML IV EMUL
INTRAVENOUS | Status: DC | PRN
  Administered 2018-07-26: 75 ug/kg/min via INTRAVENOUS

## 2018-07-26 MED ORDER — ONDANSETRON HCL 4 MG PO TABS: 4.0000 mg | ORAL_TABLET | Freq: Three times a day (TID) | ORAL | 0 refills | Status: DC | PRN

## 2018-07-26 MED ORDER — CEFAZOLIN SODIUM-DEXTROSE 2-4 GM/100ML-% IV SOLN
2.0000 g | Freq: Four times a day (QID) | INTRAVENOUS | Status: AC
  Administered 2018-07-26 (×2): 2 g via INTRAVENOUS
  Filled 2018-07-26 (×2): qty 100

## 2018-07-26 MED ORDER — PRAVASTATIN SODIUM 20 MG PO TABS
40.0000 mg | ORAL_TABLET | Freq: Every day | ORAL | Status: DC
  Administered 2018-07-27: 40 mg via ORAL
  Filled 2018-07-26: qty 2

## 2018-07-26 MED ORDER — LISINOPRIL 10 MG PO TABS
10.0000 mg | ORAL_TABLET | Freq: Every day | ORAL | Status: DC
  Administered 2018-07-27: 10 mg via ORAL
  Filled 2018-07-26: qty 1

## 2018-07-26 MED ORDER — DOXYLAMINE SUCCINATE (SLEEP) 25 MG PO TABS
75.0000 mg | ORAL_TABLET | Freq: Every evening | ORAL | Status: DC | PRN
  Filled 2018-07-26: qty 3

## 2018-07-26 MED ORDER — MIDAZOLAM HCL 2 MG/2ML IJ SOLN
INTRAMUSCULAR | Status: AC
  Filled 2018-07-26: qty 2

## 2018-07-26 MED ORDER — SODIUM CHLORIDE (PF) 0.9 % IJ SOLN
INTRAMUSCULAR | Status: AC
  Filled 2018-07-26: qty 50

## 2018-07-26 MED ORDER — ONDANSETRON HCL 4 MG/2ML IJ SOLN: 4.0000 mg | Freq: Four times a day (QID) | INTRAMUSCULAR | Status: DC | PRN

## 2018-07-26 MED ORDER — SORBITOL 70 % SOLN
30.0000 mL | Freq: Every day | Status: DC | PRN
  Filled 2018-07-26: qty 30

## 2018-07-26 MED ORDER — ONDANSETRON HCL 4 MG PO TABS: 4.0000 mg | ORAL_TABLET | Freq: Four times a day (QID) | ORAL | Status: DC | PRN

## 2018-07-26 MED ORDER — PHENOL 1.4 % MT LIQD
1.0000 | OROMUCOSAL | Status: DC | PRN
  Filled 2018-07-26: qty 177

## 2018-07-26 MED ORDER — ASPIRIN EC 81 MG PO TBEC: 81.0000 mg | DELAYED_RELEASE_TABLET | Freq: Two times a day (BID) | ORAL | 0 refills | Status: DC

## 2018-07-26 MED ORDER — AMLODIPINE BESYLATE 10 MG PO TABS
10.0000 mg | ORAL_TABLET | Freq: Every day | ORAL | Status: DC
  Administered 2018-07-27: 10 mg via ORAL
  Filled 2018-07-26: qty 1

## 2018-07-26 MED ORDER — MENTHOL 3 MG MT LOZG: 1.0000 | LOZENGE | OROMUCOSAL | Status: DC | PRN

## 2018-07-26 MED ORDER — ACETAMINOPHEN 500 MG PO TABS: 1000.0000 mg | ORAL_TABLET | Freq: Once | ORAL | Status: DC

## 2018-07-26 MED ORDER — DEXAMETHASONE SODIUM PHOSPHATE 10 MG/ML IJ SOLN
INTRAMUSCULAR | Status: DC | PRN
  Administered 2018-07-26: 10 mg via INTRAVENOUS

## 2018-07-26 MED ORDER — BUPIVACAINE IN DEXTROSE 0.75-8.25 % IT SOLN
INTRATHECAL | Status: DC | PRN
  Administered 2018-07-26: 1.6 mL via INTRATHECAL

## 2018-07-26 MED ORDER — MIDAZOLAM HCL 2 MG/2ML IJ SOLN
1.0000 mg | INTRAMUSCULAR | Status: AC
  Administered 2018-07-26: 2 mg via INTRAVENOUS
  Filled 2018-07-26: qty 2

## 2018-07-26 MED ORDER — MIDAZOLAM HCL 5 MG/5ML IJ SOLN
INTRAMUSCULAR | Status: DC | PRN
  Administered 2018-07-26: 2 mg via INTRAVENOUS

## 2018-07-26 MED ORDER — MAGNESIUM CITRATE PO SOLN: 1.0000 | Freq: Once | ORAL | Status: DC | PRN

## 2018-07-26 MED ORDER — OXYCODONE HCL 5 MG PO TABS
5.0000 mg | ORAL_TABLET | ORAL | Status: DC | PRN
  Administered 2018-07-27: 5 mg via ORAL
  Filled 2018-07-26: qty 1

## 2018-07-26 MED ORDER — METOCLOPRAMIDE HCL 5 MG PO TABS: 5.0000 mg | ORAL_TABLET | Freq: Three times a day (TID) | ORAL | Status: DC | PRN

## 2018-07-26 MED ORDER — DEXAMETHASONE SODIUM PHOSPHATE 10 MG/ML IJ SOLN
10.0000 mg | Freq: Once | INTRAMUSCULAR | Status: AC
  Administered 2018-07-27: 10 mg via INTRAVENOUS
  Filled 2018-07-26: qty 1

## 2018-07-26 MED ORDER — ROPIVACAINE HCL 7.5 MG/ML IJ SOLN
INTRAMUSCULAR | Status: DC | PRN
  Administered 2018-07-26: 20 mL via PERINEURAL

## 2018-07-26 MED ORDER — FENTANYL CITRATE (PF) 100 MCG/2ML IJ SOLN
50.0000 ug | INTRAMUSCULAR | Status: AC
  Administered 2018-07-26: 50 ug via INTRAVENOUS
  Filled 2018-07-26: qty 2

## 2018-07-26 MED ORDER — 0.9 % SODIUM CHLORIDE (POUR BTL) OPTIME
TOPICAL | Status: DC | PRN
  Administered 2018-07-26: 1000 mL

## 2018-07-26 MED ORDER — GABAPENTIN 300 MG PO CAPS
300.0000 mg | ORAL_CAPSULE | Freq: Three times a day (TID) | ORAL | Status: DC
  Administered 2018-07-26 – 2018-07-27 (×3): 300 mg via ORAL
  Filled 2018-07-26 (×3): qty 1

## 2018-07-26 MED ORDER — SODIUM CHLORIDE 0.9 % IV SOLN
INTRAVENOUS | Status: DC | PRN
  Administered 2018-07-26: 20 ug/min via INTRAVENOUS

## 2018-07-26 MED ORDER — SODIUM CHLORIDE 0.9 % IR SOLN
Status: DC | PRN
  Administered 2018-07-26: 1000 mL

## 2018-07-26 MED ORDER — POLYETHYLENE GLYCOL 3350 17 G PO PACK: 17.0000 g | PACK | Freq: Every day | ORAL | Status: DC | PRN

## 2018-07-26 MED ORDER — ACETAMINOPHEN 500 MG PO TABS
1000.0000 mg | ORAL_TABLET | Freq: Three times a day (TID) | ORAL | Status: DC
  Administered 2018-07-26 – 2018-07-27 (×3): 1000 mg via ORAL
  Filled 2018-07-26 (×3): qty 2

## 2018-07-26 MED ORDER — DOCUSATE SODIUM 100 MG PO CAPS: 100.0000 mg | ORAL_CAPSULE | Freq: Two times a day (BID) | ORAL | 0 refills | Status: DC

## 2018-07-26 MED ORDER — ASPIRIN 81 MG PO CHEW
81.0000 mg | CHEWABLE_TABLET | Freq: Two times a day (BID) | ORAL | Status: DC
  Administered 2018-07-26 – 2018-07-27 (×2): 81 mg via ORAL
  Filled 2018-07-26 (×2): qty 1

## 2018-07-26 MED ORDER — GABAPENTIN 300 MG PO CAPS
300.0000 mg | ORAL_CAPSULE | Freq: Once | ORAL | Status: AC
  Administered 2018-07-26: 300 mg via ORAL
  Filled 2018-07-26: qty 1

## 2018-07-26 SURGICAL SUPPLY — 53 items
BANDAGE ELASTIC 6 VELCRO ST LF (GAUZE/BANDAGES/DRESSINGS) ×2 IMPLANT
BEARING MENISCAL TIBIAL 4 MD R (Orthopedic Implant) ×1 IMPLANT
BLADE SURG 15 STRL LF C SS BP (BLADE) ×1 IMPLANT
BLADE SURG 15 STRL SS (BLADE) ×2
BNDG CMPR MED 10X6 ELC LF (GAUZE/BANDAGES/DRESSINGS) ×1
BNDG ELASTIC 6X10 VLCR STRL LF (GAUZE/BANDAGES/DRESSINGS) ×2 IMPLANT
BOWL SMART MIX CTS (DISPOSABLE) ×2 IMPLANT
BRNG TIB MED 4 PHS 3 RT MEN (Orthopedic Implant) ×1 IMPLANT
CEMENT BONE R 1X40 (Cement) ×2 IMPLANT
CHLORAPREP W/TINT 26ML (MISCELLANEOUS) ×2 IMPLANT
COVER SURGICAL LIGHT HANDLE (MISCELLANEOUS) ×2 IMPLANT
COVER WAND RF STERILE (DRAPES) IMPLANT
CUFF TOURN SGL QUICK 34 (TOURNIQUET CUFF) ×2
CUFF TRNQT CYL 34X4X40X1 (TOURNIQUET CUFF) ×1 IMPLANT
DRAPE ARTHROSCOPY W/POUCH 114 (DRAPES) ×2 IMPLANT
DRAPE IMP U-DRAPE 54X76 (DRAPES) ×4 IMPLANT
DRAPE U-SHAPE 47X51 STRL (DRAPES) ×2 IMPLANT
DRSG MEPILEX BORDER 4X4 (GAUZE/BANDAGES/DRESSINGS) IMPLANT
DRSG MEPILEX BORDER 4X8 (GAUZE/BANDAGES/DRESSINGS) ×2 IMPLANT
ELECT REM PT RETURN 15FT ADLT (MISCELLANEOUS) ×2 IMPLANT
FORCEPS GRASP 3 PRONG 3.2FR (CUTTING FORCEPS) ×2 IMPLANT
GLOVE BIO SURGEON STRL SZ7.5 (GLOVE) ×4 IMPLANT
GLOVE BIOGEL PI IND STRL 8 (GLOVE) ×2 IMPLANT
GLOVE BIOGEL PI INDICATOR 8 (GLOVE) ×2
GOWN STRL REUS W/ TWL LRG LVL3 (GOWN DISPOSABLE) ×1 IMPLANT
GOWN STRL REUS W/ TWL XL LVL3 (GOWN DISPOSABLE) ×1 IMPLANT
GOWN STRL REUS W/TWL LRG LVL3 (GOWN DISPOSABLE) ×2
GOWN STRL REUS W/TWL XL LVL3 (GOWN DISPOSABLE) ×2
HANDPIECE INTERPULSE COAX TIP (DISPOSABLE) ×2
IMMOBILIZER KNEE 20 (SOFTGOODS) ×3 IMPLANT
IMMOBILIZER KNEE 20 THIGH 36 (SOFTGOODS) ×1 IMPLANT
KNEE UNICOMP MEDIAL OXFORD RT (Joint) ×1 IMPLANT
MANIFOLD NEPTUNE II (INSTRUMENTS) ×2 IMPLANT
NS IRRIG 1000ML POUR BTL (IV SOLUTION) ×2 IMPLANT
PACK BLADE SAW RECIP 70 3 PT (BLADE) ×1 IMPLANT
PACK ICE MAXI GEL EZY WRAP (MISCELLANEOUS) ×2 IMPLANT
PACK TOTAL KNEE CUSTOM (KITS) ×2 IMPLANT
PEG TWIN FEM CEMENTED MED (Knees) ×2 IMPLANT
PROTECTOR NERVE ULNAR (MISCELLANEOUS) ×2 IMPLANT
SET HNDPC FAN SPRY TIP SCT (DISPOSABLE) ×1 IMPLANT
STRIP CLOSURE SKIN 1/2X4 (GAUZE/BANDAGES/DRESSINGS) ×2 IMPLANT
SUCTION FRAZIER HANDLE 10FR (MISCELLANEOUS) ×1
SUCTION TUBE FRAZIER 10FR DISP (MISCELLANEOUS) ×1 IMPLANT
SUT MNCRL AB 4-0 PS2 18 (SUTURE) ×2 IMPLANT
SUT VIC AB 0 CT1 27 (SUTURE) ×2
SUT VIC AB 0 CT1 27XBRD ANTBC (SUTURE) ×1 IMPLANT
SUT VIC AB 0 CT1 36 (SUTURE) ×2 IMPLANT
SUT VIC AB 1 CT1 36 (SUTURE) ×2 IMPLANT
SUT VIC AB 2-0 CT1 27 (SUTURE) ×2
SUT VIC AB 2-0 CT1 TAPERPNT 27 (SUTURE) ×1 IMPLANT
TRAY FOLEY MTR SLVR 16FR STAT (SET/KITS/TRAYS/PACK) ×2 IMPLANT
WRAP KNEE MAXI GEL POST OP (GAUZE/BANDAGES/DRESSINGS) ×1 IMPLANT
YANKAUER SUCT BULB TIP 10FT TU (MISCELLANEOUS) ×2 IMPLANT

## 2018-07-26 NOTE — Op Note (Signed)
07/26/2018  4:08 PM  PATIENT:  Terry Guerra    PRE-OPERATIVE DIAGNOSIS:  OA RIGHT KNEE  POST-OPERATIVE DIAGNOSIS:  Same  PROCEDURE:  UNICOMPARTMENTAL RIGHT KNEE  SURGEON:  Sheral Apleyimothy D Tifini Reeder, MD  PHYSICIAN ASSISTANT: Aquilla HackerHenry Martensen, PA-C, he was present and scrubbed throughout the case, critical for completion in a timely fashion, and for retraction, instrumentation, and closure.   ANESTHESIA:   General  PREOPERATIVE INDICATIONS:  Terry JollyJoseph S Rimmer is a  57 y.o. male with a diagnosis of OA RIGHT KNEE who failed conservative measures and elected for surgical management.    The risks benefits and alternatives were discussed with the patient preoperatively including but not limited to the risks of infection, bleeding, nerve injury, cardiopulmonary complications, blood clots, the need for revision surgery, among others, and the patient was willing to proceed.  OPERATIVE IMPLANTS: Biomet Oxford mobile bearing medial compartment arthroplasty. Femoral Component: med. Tibial tray: D, Size 4 poly.   OPERATIVE FINDINGS: Endstage grade 4 medial compartment osteoarthritis. No significant changes in the lateral or patellofemoral joint  OPERATIVE PROCEDURE: The patient was brought to the operating room placed in supine position. General anesthesia was administered. IV antibiotics were given. The lower extremity was placed in the legholder and prepped and draped in usual sterile fashion.  Time out was performed.  The leg was elevated and exsanguinated and the tourniquet was inflated. Anteromedial incision was performed, and I took care to preserve the MCL. Parapatellar incision was carried out, and the osteophytes were excised, along with the medial meniscus and a small portion of the fat pad.  The extra medullary tibial cutting jig was applied, using the spoon and the 4mm G-Clamp, and I took care to protect the anterior cruciate ligament insertion and the tibial spine. The medial collateral ligament  was also protected, and I resected my proximal tibia, matching the anatomic slope.   The proximal tibial bony cut was removed in one piece, and I turned my attention to the femur.  The intramedullary femoral rod was placed using the drill, and then using the appropriate reference, I assembled the femoral jig, setting my posterior cutting block. I resected my posterior femur, and then measured my gap.   I then used the mill to match the extension gap to the flexion gap. The gaps were then measured again with the appropriate feeler gauges. Once I had balanced flexion and extension gaps, I then completed the preparation of the femur.  I milled off the anterior aspect of the distal femur to prevent impingement. I also exposed the tibia, and selected the above-named component, and then used the cutting jig to prepare the keel slot on the tibia. I also used the awl to curette out the bone to complete the preparation of the keel. The back wall was intact.  I then placed trial components, and it was found to have excellent motion, and appropriate balance.  I then cemented the components into place, cementing the tibia first, removing all excess cement, and then cementing the femur.  All loose cement was removed.  The real polyethylene insert was applied manually, and the knee was taken through functional range of motion, and found to have excellent stability and restoration of joint motion, with excellent balance.  The wounds were irrigated copiously, and the parapatellar tissue closed with Vicryl, followed by Vicryl for the subcutaneous tissue, with routine closure with Steri-Strips and sterile gauze.  The tourniquet was released, and the patient was awakened and extubated and returned to PACU  in stable and satisfactory condition. There were no complications.  POSTOPERATIVE PLAN: DVT px will consist of SCD's, mobiliation and chemical px, WBAT     Renette Butters, MD

## 2018-07-26 NOTE — Interval H&P Note (Signed)
I participated in the care of this patient and agree with the above history, physical and evaluation. I performed a review of the history and a physical exam as detailed   Patrich Heinze Daniel Charene Mccallister MD  

## 2018-07-26 NOTE — Anesthesia Procedure Notes (Addendum)
Anesthesia Regional Block: Adductor canal block   Pre-Anesthetic Checklist: ,, timeout performed, Correct Patient, Correct Site, Correct Laterality, Correct Procedure, Correct Position, site marked, Risks and benefits discussed,  Surgical consent,  Pre-op evaluation,  At surgeon's request and post-op pain management  Laterality: Right  Prep: Maximum Sterile Barrier Precautions used, chloraprep       Needles:  Injection technique: Single-shot  Needle Type: Echogenic Stimulator Needle     Needle Length: 9cm  Needle Gauge: 22     Additional Needles:   Procedures:,,,, ultrasound used (permanent image in chart),,,,  Narrative:  Start time: 07/26/2018 11:49 AM End time: 07/26/2018 11:59 AM Injection made incrementally with aspirations every 5 mL.  Performed by: Personally  Anesthesiologist: Elmer PickerWoodrum, Judithann Villamar L, MD  Additional Notes: Monitors applied. No increased pain on injection. No increased resistance to injection. Injection made in 5cc increments. Good needle visualization. Patient tolerated procedure well.

## 2018-07-26 NOTE — Progress Notes (Signed)
Assisted Dr. Woodrum with right, ultrasound guided, adductor canal block. Side rails up, monitors on throughout procedure. See vital signs in flow sheet. Tolerated Procedure well.  

## 2018-07-26 NOTE — Anesthesia Preprocedure Evaluation (Addendum)
Anesthesia Evaluation  Patient identified by MRN, date of birth, ID band Patient awake    Reviewed: Allergy & Precautions, NPO status , Patient's Chart, lab work & pertinent test results  Airway Mallampati: II  TM Distance: >3 FB Neck ROM: Full    Dental no notable dental hx. (+) Teeth Intact, Dental Advisory Given   Pulmonary sleep apnea (does not use CPAP) , former smoker,    Pulmonary exam normal breath sounds clear to auscultation       Cardiovascular hypertension, negative cardio ROS Normal cardiovascular exam Rhythm:Regular Rate:Normal     Neuro/Psych negative neurological ROS  negative psych ROS   GI/Hepatic negative GI ROS, Neg liver ROS,   Endo/Other  negative endocrine ROS  Renal/GU negative Renal ROS  negative genitourinary   Musculoskeletal  (+) Arthritis , Osteoarthritis,    Abdominal   Peds negative pediatric ROS (+)  Hematology negative hematology ROS (+)   Anesthesia Other Findings   Reproductive/Obstetrics negative OB ROS                            Anesthesia Physical Anesthesia Plan  ASA: II  Anesthesia Plan: Spinal and Regional   Post-op Pain Management:  Regional for Post-op pain   Induction: Intravenous  PONV Risk Score and Plan: 1 and Treatment may vary due to age or medical condition  Airway Management Planned: Natural Airway and Simple Face Mask  Additional Equipment:   Intra-op Plan:   Post-operative Plan:   Informed Consent: I have reviewed the patients History and Physical, chart, labs and discussed the procedure including the risks, benefits and alternatives for the proposed anesthesia with the patient or authorized representative who has indicated his/her understanding and acceptance.   Dental advisory given  Plan Discussed with: CRNA  Anesthesia Plan Comments:         Anesthesia Quick Evaluation

## 2018-07-26 NOTE — Anesthesia Procedure Notes (Signed)
Spinal  Patient location during procedure: OR Start time: 07/26/2018 12:50 PM End time: 07/26/2018 1:00 PM Staffing Anesthesiologist: Leonides GrillsEllender, Elvie Palomo P, MD Performed: anesthesiologist  Preanesthetic Checklist Completed: patient identified, surgical consent, pre-op evaluation, timeout performed, IV checked, risks and benefits discussed and monitors and equipment checked Spinal Block Patient position: sitting Prep: DuraPrep Patient monitoring: cardiac monitor, continuous pulse ox and blood pressure Approach: midline Location: L4-5 Injection technique: single-shot Needle Needle type: Pencan  Needle gauge: 24 G Needle length: 9 cm Assessment Sensory level: T10 Additional Notes Functioning IV was confirmed and monitors were applied. Sterile prep and drape, including hand hygiene and sterile gloves were used. The patient was positioned and the spine was prepped. The skin was anesthetized with lidocaine.  Free flow of clear CSF was obtained prior to injecting local anesthetic into the CSF.  The spinal needle aspirated freely following injection.  The needle was carefully withdrawn.  The patient tolerated the procedure well.

## 2018-07-26 NOTE — Transfer of Care (Signed)
Immediate Anesthesia Transfer of Care Note  Patient: Terry Guerra  Procedure(s) Performed: Procedure(s): UNICOMPARTMENTAL RIGHT KNEE (Right)  Patient Location: PACU  Anesthesia Type:Spinal  Level of Consciousness:  sedated, patient cooperative and responds to stimulation  Airway & Oxygen Therapy:Patient Spontanous Breathing and Patient connected to face mask oxgen  Post-op Assessment:  Report given to PACU RN and Post -op Vital signs reviewed and stable  Post vital signs:  Reviewed and stable  Last Vitals:  Vitals:   07/26/18 1215 07/26/18 1220  BP: 122/83 (!) 134/94  Pulse: (!) 52 80  Resp: 16 16  Temp:    SpO2: 96% 96%    Complications: No apparent anesthesia complications

## 2018-07-26 NOTE — Discharge Instructions (Signed)

## 2018-07-27 ENCOUNTER — Encounter (HOSPITAL_COMMUNITY): Payer: Self-pay | Admitting: Orthopedic Surgery

## 2018-07-27 DIAGNOSIS — Z7982 Long term (current) use of aspirin: Secondary | ICD-10-CM | POA: Diagnosis not present

## 2018-07-27 DIAGNOSIS — I10 Essential (primary) hypertension: Secondary | ICD-10-CM | POA: Diagnosis not present

## 2018-07-27 DIAGNOSIS — Z79899 Other long term (current) drug therapy: Secondary | ICD-10-CM | POA: Diagnosis not present

## 2018-07-27 DIAGNOSIS — Z87891 Personal history of nicotine dependence: Secondary | ICD-10-CM | POA: Diagnosis not present

## 2018-07-27 DIAGNOSIS — M1711 Unilateral primary osteoarthritis, right knee: Secondary | ICD-10-CM | POA: Diagnosis not present

## 2018-07-27 DIAGNOSIS — E785 Hyperlipidemia, unspecified: Secondary | ICD-10-CM | POA: Diagnosis not present

## 2018-07-27 DIAGNOSIS — G4733 Obstructive sleep apnea (adult) (pediatric): Secondary | ICD-10-CM | POA: Diagnosis not present

## 2018-07-27 MED ORDER — PROPOFOL 10 MG/ML IV BOLUS
INTRAVENOUS | Status: AC
  Filled 2018-07-27: qty 60

## 2018-07-27 NOTE — Progress Notes (Signed)
Discharge instructions and prescriptions reviewed with patient using teach back method no questions at this time. Patient being discharged to home

## 2018-07-27 NOTE — Plan of Care (Signed)

## 2018-07-27 NOTE — Discharge Summary (Signed)
Discharge Summary  Patient ID: Terry Guerra MRN: [id number] DOB/AGE: 58/08/1960 58 y.o.  Admit date: 07/26/2018 Discharge date: 07/27/2018  Admission Diagnoses:  Primary osteoarthritis of right knee  Discharge Diagnoses:  Principal Problem:   Primary osteoarthritis of right knee Active Problems:   HTN (hypertension)   Dyslipidemia   OSA (obstructive sleep apnea)   Primary localized osteoarthritis of knee   Past Medical History:  Diagnosis Date  . Abrasion of shoulder, left   . Arthritis    knees  . Hypertension   . Shoulder pain, left    had cortisone shots with reported relief   . Sleep apnea    no CPAP    Surgeries: Procedure(s): UNICOMPARTMENTAL RIGHT KNEE on 07/26/2018   Consultants (if any):   Discharged Condition: Improved  Hospital Course: Terry JollyJoseph S Guerra is an 57 y.o. male who was admitted 07/26/2018 with a diagnosis of Primary osteoarthritis of right knee and went to the operating room on 07/26/2018 and underwent the above named procedures.    He was given perioperative antibiotics:  Anti-infectives (From admission, onward)   Start     Dose/Rate Route Frequency Ordered Stop   07/26/18 1800  ceFAZolin (ANCEF) IVPB 2g/100 mL premix     2 g 200 mL/hr over 30 Minutes Intravenous Every 6 hours 07/26/18 1628 07/27/18 0018   07/26/18 1045  ceFAZolin (ANCEF) IVPB 2g/100 mL premix     2 g 200 mL/hr over 30 Minutes Intravenous On call to O.R. 07/26/18 1031 07/26/18 1259    .  He was given sequential compression devices, early ambulation, and Aspirin for DVT prophylaxis.  He benefited maximally from the hospital stay and there were no complications.    Recent vital signs:  Vitals:   07/27/18 0207 07/27/18 0542  BP: (!) 138/93 (!) 146/90  Pulse: 92 87  Resp: 16 16  Temp: 98.1 F (36.7 C) 97.7 F (36.5 C)  SpO2: 98% 94%    Recent laboratory studies:  Lab Results  Component Value Date   HGB 14.3 07/19/2018   HGB 15.2 05/31/2018   HGB 15.0 08/26/2017    Lab Results  Component Value Date   WBC 5.5 07/19/2018   PLT 286 07/19/2018   Lab Results  Component Value Date   INR 0.9 05/12/2009   Lab Results  Component Value Date   NA 139 07/01/2018   K 4.4 07/01/2018   CL 103 07/01/2018   CO2 23 07/01/2018   BUN 12 07/01/2018   CREATININE 0.93 07/01/2018   GLUCOSE 103 (H) 07/01/2018    Discharge Medications:   Allergies as of 07/27/2018   No Known Allergies     Medication List    STOP taking these medications   ibuprofen 200 MG tablet Commonly known as:  ADVIL,MOTRIN     TAKE these medications   acetaminophen 500 MG tablet Commonly known as:  TYLENOL Take 2 tablets (1,000 mg total) by mouth every 8 (eight) hours for 14 days. For Pain. What changed:    how much to take  when to take this  reasons to take this  additional instructions   amLODipine 10 MG tablet Commonly known as:  NORVASC Take 10 mg by mouth daily.   aspirin EC 81 MG tablet Take 1 tablet (81 mg total) by mouth 2 (two) times daily. For DVT prophylaxis for 30 days after surgery.   docusate sodium 100 MG capsule Commonly known as:  COLACE Take 1 capsule (100 mg total) by mouth 2 (two) times  daily. To prevent constipation while taking pain medication.   doxylamine (Sleep) 25 MG tablet Commonly known as:  UNISOM Take 75 mg by mouth at bedtime.   gabapentin 300 MG capsule Commonly known as:  NEURONTIN Take 1 capsule (300 mg total) by mouth 2 (two) times daily for 14 days. For 2 weeks post op for pain.   lisinopril 10 MG tablet Commonly known as:  PRINIVIL,ZESTRIL Take 1 tablet (10 mg total) by mouth daily.   methocarbamol 500 MG tablet Commonly known as:  ROBAXIN Take 1 tablet (500 mg total) by mouth every 8 (eight) hours as needed for muscle spasms.   ondansetron 4 MG tablet Commonly known as:  ZOFRAN Take 1 tablet (4 mg total) by mouth every 8 (eight) hours as needed for nausea or vomiting.   oxyCODONE 5 MG immediate release  tablet Commonly known as:  Oxy IR/ROXICODONE Take 1 tablet (5 mg total) by mouth every 4 (four) hours as needed for breakthrough pain.   pravastatin 40 MG tablet Commonly known as:  PRAVACHOL TAKE 1 TABLET BY MOUTH EVERY DAY       Diagnostic Studies: Dg Knee Right Port  Result Date: 07/26/2018 CLINICAL DATA:  S/p RIGHT knee hemiarthroplasty EXAM: PORTABLE RIGHT KNEE - 1-2 VIEW COMPARISON:  None. FINDINGS: RIGHT knee MEDIAL compartment hemiarthroplasty changes identified. The spacer is medially situated relative to the tibial tray, and slightly medially situated relative to the unicondylar component. Soft tissue postoperative changes are identified. IMPRESSION: Medially situated spacer relative to the tibial tray and unicondylar component of MEDIAL compartment hemiarthroplasty. Electronically Signed   By: Harmon Pier M.D.   On: 07/26/2018 19:43    Disposition: Discharge disposition: 01-Home or Self Care       Discharge Instructions    Discharge patient   Complete by:  As directed    Discharge disposition:  01-Home or Self Care   Discharge patient date:  07/27/2018      Follow-up Information    Sheral Apley, MD Follow up.   Specialty:  Orthopedic Surgery Contact information: [address] [place] [address] [phone]            Signed: Albina Billet III PA-C 07/27/2018, 8:18 AM

## 2018-07-27 NOTE — Anesthesia Postprocedure Evaluation (Signed)
Anesthesia Post Note  Patient: Terry Guerra  Procedure(s) Performed: UNICOMPARTMENTAL RIGHT KNEE (Right Knee)     Patient location during evaluation: PACU Anesthesia Type: Regional and Spinal Level of consciousness: oriented and awake and alert Pain management: pain level controlled Vital Signs Assessment: post-procedure vital signs reviewed and stable Respiratory status: spontaneous breathing, respiratory function stable and patient connected to nasal cannula oxygen Cardiovascular status: blood pressure returned to baseline and stable Postop Assessment: no headache, no backache, no apparent nausea or vomiting and spinal receding Anesthetic complications: no    Last Vitals:  Vitals:   07/27/18 1145 07/27/18 1146  BP: (!) 179/86 (!) 153/97  Pulse: 99 98  Resp: 20   Temp:    SpO2: 96% 93%    Last Pain:  Vitals:   07/27/18 1007  TempSrc: Oral  PainSc:                  Raylan Troiani P Jonanthan Bolender

## 2018-07-27 NOTE — Progress Notes (Signed)
    Subjective: Patient reports pain as mild.  Tolerating diet.  Urinating.  No CP, SOB.  OOB in room.  Objective:   VITALS:   Vitals:   07/26/18 1933 07/26/18 2046 07/27/18 0207 07/27/18 0542  BP: 133/82 132/82 (!) 138/93 (!) 146/90  Pulse: (!) 101 (!) 110 92 87  Resp: 16 16 16 16   Temp: 97.7 F (36.5 C)  98.1 F (36.7 C) 97.7 F (36.5 C)  TempSrc: Oral     SpO2: 96% 95% 98% 94%  Weight:      Height:       CBC Latest Ref Rng & Units 07/19/2018 05/31/2018 08/26/2017  WBC 4.0 - 10.5 K/uL 5.5 5.4 5.1  Hemoglobin 13.0 - 17.0 g/dL 16.114.3 09.615.2 04.515.0  Hematocrit 39.0 - 52.0 % 41.9 43.8 42.8  Platelets 150 - 400 K/uL 286 - 273   BMP Latest Ref Rng & Units 07/01/2018 05/31/2018 08/26/2017  Glucose 65 - 99 mg/dL 409(W103(H) 86 119(J111(H)  BUN 6 - 24 mg/dL 12 15 13   Creatinine 0.76 - 1.27 mg/dL 4.780.93 2.950.91 6.210.85  BUN/Creat Ratio 9 - 20 13 16  -  Sodium 134 - 144 mmol/L 139 140 137  Potassium 3.5 - 5.2 mmol/L 4.4 4.0 3.7  Chloride 96 - 106 mmol/L 103 104 105  CO2 20 - 29 mmol/L 23 20 22   Calcium 8.7 - 10.2 mg/dL 30.810.2 10.3(H) 9.9   Intake/Output      12/03 0701 - 12/04 0700 12/04 0701 - 12/05 0700   P.O. 680    I.V. (mL/kg) 2905.7 (30.4)    IV Piggyback 300    Total Intake(mL/kg) 3885.7 (40.6)    Urine (mL/kg/hr) 2425    Blood 50    Total Output 2475    Net +1410.7            Physical Exam: General: NAD.  Upright in bed on arrival.  Calm, conversant. Resp: No increased wob Cardio: regular rate and rhythm ABD soft Neurologically intact MSK Neurovascularly intact Sensation intact distally Intact pulses distally Dorsiflexion/Plantar flexion intact Incision: dressing C/D/I   Assessment: 1 Day Post-Op  S/P Procedure(s) (LRB): UNICOMPARTMENTAL RIGHT KNEE (Right) by Dr. Jewel Baizeimothy D. Eulah PontMurphy on 07/26/2017  Principal Problem:   Primary osteoarthritis of right knee Active Problems:   HTN (hypertension)   Dyslipidemia   OSA (obstructive sleep apnea)   Primary localized osteoarthritis of  knee   Primary osteoarthritis, status post right unicompartmental knee arthroplasty Doing well postop day 1 Eating, drinking, and voiding Pain controlled Up out of bed in room  Plan: Up with therapy Incentive Spirometry Elevate and Apply ice CPM, bone foam  Weight Bearing: Weight Bearing as Tolerated (WBAT)  Dressings: Maintain Mepilex.  Please apply thigh high TED hose to operative leg prior to discharge. VTE prophylaxis: Aspirin, SCDs, ambulation Dispo: Home today    Patient's anticipated LOS is less than 2 midnights, meeting these requirements: - Younger than 3665 - Lives within 1 hour of care - Has a competent adult at home to recover with post-op recover - NO history of  - Chronic pain requiring opiods  - Diabetes  - Coronary Artery Disease  - Heart failure  - Heart attack  - Stroke  - DVT/VTE  - Cardiac arrhythmia  - Respiratory Failure/COPD  - Renal failure  - Anemia  - Advanced Liver disease    Terry BilletHenry Calvin Martensen III, PA-C 07/27/2018, 8:19 AM

## 2018-07-27 NOTE — Evaluation (Signed)
Physical Therapy Evaluation Patient Details Name: Terry Guerra MRN: [id number] DOB: [birth year] Today's Date: 07/27/2018   History of Present Illness  Pt s/p R UKR and with hx of L UKR  Clinical Impression  Pt s/p R UKR and presents with functional mobility limitations 2* decreased R LE strength/ROM and post op pain.  Pt should progress to dc home with family assist and plans to start OP PT 12//6/19.    Follow Up Recommendations Outpatient PT    Equipment Recommendations  None recommended by PT    Recommendations for Other Services       Precautions / Restrictions Precautions Precautions: Knee;Fall Restrictions Weight Bearing Restrictions: No      Mobility  Bed Mobility Overal bed mobility: Modified Independent             General bed mobility comments: Pt unassisted to EOB  Transfers Overall transfer level: Needs assistance Equipment used: Rolling walker (2 wheeled) Transfers: Sit to/from Stand Sit to Stand: Min guard;Supervision         General transfer comment: cues for use of UEs to self assist  Ambulation/Gait Ambulation/Gait assistance: Min guard;Supervision Gait Distance (Feet): 400 Feet Assistive device: Rolling walker (2 wheeled) Gait Pattern/deviations: Step-to pattern;Step-through pattern;Decreased step length - right;Decreased step length - left;Shuffle;Trunk flexed     General Gait Details: cues for posture and position from RW  Stairs Stairs: Yes Stairs assistance: Min guard Stair Management: No rails;Step to pattern;Forwards;With walker Number of Stairs: 1 General stair comments: min cues for sequence  Wheelchair Mobility    Modified Rankin (Stroke Patients Only)       Balance Overall balance assessment: Mild deficits observed, not formally tested                                           Pertinent Vitals/Pain Pain Assessment: 0-10 Pain Score: 4  Pain Location: R knee Pain Descriptors / Indicators:  Aching;Sore Pain Intervention(s): Limited activity within patient's tolerance;Monitored during session;Premedicated before session;Ice applied    Home Living Family/patient expects to be discharged to:: Private residence Living Arrangements: Spouse/significant other Available Help at Discharge: Family;Available 24 hours/day Type of Home: House Home Access: Stairs to enter Entrance Stairs-Rails: None Entrance Stairs-Number of Steps: 1+1+1+1 Home Layout: One level Home Equipment: Walker - 2 wheels;Bedside commode;Cane - single point Additional Comments: Reports GF and dtr will help as needed    Prior Function Level of Independence: Independent               Hand Dominance        Extremity/Trunk Assessment   Upper Extremity Assessment Upper Extremity Assessment: Overall WFL for tasks assessed    Lower Extremity Assessment Lower Extremity Assessment: RLE deficits/detail RLE Deficits / Details: 3/5 quads with IND SLR;  AAROM at knee -5 - 90    Cervical / Trunk Assessment Cervical / Trunk Assessment: Normal  Communication   Communication: No difficulties  Cognition Arousal/Alertness: Awake/alert Behavior During Therapy: WFL for tasks assessed/performed Overall Cognitive Status: Within Functional Limits for tasks assessed                                        General Comments      Exercises Total Joint Exercises Ankle Circles/Pumps: AROM;Both;15 reps;Supine Quad Sets: AROM;Both;10 reps;Supine Heel Slides: AAROM;Right;15  reps;Supine Straight Leg Raises: AROM;Right;15 reps;Supine Long Arc Quad: AROM;Right;10 reps;Supine Knee Flexion: Right;10 reps;AAROM;AROM;Seated   Assessment/Plan    PT Assessment Patient needs continued PT services  PT Problem List Decreased strength;Decreased range of motion;Decreased activity tolerance;Decreased mobility;Decreased knowledge of use of DME;Pain       PT Treatment Interventions DME instruction;Gait  training;Stair training;Functional mobility training;Therapeutic activities;Therapeutic exercise;Patient/family education    PT Goals (Current goals can be found in the Care Plan section)  Acute Rehab PT Goals Patient Stated Goal: Regain IND PT Goal Formulation: All assessment and education complete, DC therapy Time For Goal Achievement: 07/27/18    Frequency Min 1X/week   Barriers to discharge        Co-evaluation               AM-PAC PT "6 Clicks" Mobility  Outcome Measure Help needed turning from your back to your side while in a flat bed without using bedrails?: None Help needed moving from lying on your back to sitting on the side of a flat bed without using bedrails?: None Help needed moving to and from a bed to a chair (including a wheelchair)?: None Help needed standing up from a chair using your arms (e.g., wheelchair or bedside chair)?: None Help needed to walk in hospital room?: None Help needed climbing 3-5 steps with a railing? : A Little 6 Click Score: 23    End of Session Equipment Utilized During Treatment: Gait belt Activity Tolerance: Patient tolerated treatment well Patient left: in chair;with call bell/phone within reach Nurse Communication: Mobility status PT Visit Diagnosis: Difficulty in walking, not elsewhere classified (R26.2)    Time: [id number] PT Time Calculation (min) (ACUTE ONLY): 42 min   Charges:   PT Evaluation $PT Eval Low Complexity: 1 Low PT Treatments $Gait Training: 8-22 mins $Therapeutic Exercise: 8-22 mins        Mauro KaufmannHunter Jaylen Knope PT Acute Rehabilitation Services Pager [phone] Office [phone]   Mariadelaluz G[place] 07/27/2018, 12:45 PM

## 2018-07-29 DIAGNOSIS — M25461 Effusion, right knee: Secondary | ICD-10-CM | POA: Diagnosis not present

## 2018-07-29 DIAGNOSIS — M25661 Stiffness of right knee, not elsewhere classified: Secondary | ICD-10-CM | POA: Diagnosis not present

## 2018-07-29 DIAGNOSIS — M6281 Muscle weakness (generalized): Secondary | ICD-10-CM | POA: Diagnosis not present

## 2018-07-29 DIAGNOSIS — R262 Difficulty in walking, not elsewhere classified: Secondary | ICD-10-CM | POA: Diagnosis not present

## 2018-08-01 ENCOUNTER — Telehealth: Payer: Self-pay | Admitting: Neurology

## 2018-08-01 NOTE — Telephone Encounter (Signed)
[facility] reports they are still working with his insurance. I called pt and advised him of this and gave him [employer]'s phone number. He will call then to discuss.

## 2018-08-01 NOTE — Telephone Encounter (Signed)
Pt has asked if there is an update on him being approved for a CPAP, please call

## 2018-08-01 NOTE — Telephone Encounter (Signed)
I have reached out to [facility] and asked [facility] to call him to discuss.

## 2018-08-09 DIAGNOSIS — M25661 Stiffness of right knee, not elsewhere classified: Secondary | ICD-10-CM | POA: Diagnosis not present

## 2018-08-09 DIAGNOSIS — M6281 Muscle weakness (generalized): Secondary | ICD-10-CM | POA: Diagnosis not present

## 2018-08-09 DIAGNOSIS — M25461 Effusion, right knee: Secondary | ICD-10-CM | POA: Diagnosis not present

## 2018-08-09 DIAGNOSIS — R262 Difficulty in walking, not elsewhere classified: Secondary | ICD-10-CM | POA: Diagnosis not present

## 2018-08-10 DIAGNOSIS — M25561 Pain in right knee: Secondary | ICD-10-CM | POA: Diagnosis not present

## 2018-08-12 DIAGNOSIS — R262 Difficulty in walking, not elsewhere classified: Secondary | ICD-10-CM | POA: Diagnosis not present

## 2018-08-12 DIAGNOSIS — M25661 Stiffness of right knee, not elsewhere classified: Secondary | ICD-10-CM | POA: Diagnosis not present

## 2018-08-12 DIAGNOSIS — M6281 Muscle weakness (generalized): Secondary | ICD-10-CM | POA: Diagnosis not present

## 2018-08-12 DIAGNOSIS — M25461 Effusion, right knee: Secondary | ICD-10-CM | POA: Diagnosis not present

## 2018-08-18 ENCOUNTER — Other Ambulatory Visit: Payer: Self-pay | Admitting: Family Medicine

## 2018-08-18 ENCOUNTER — Other Ambulatory Visit: Payer: Self-pay | Admitting: Emergency Medicine

## 2018-08-18 NOTE — Telephone Encounter (Signed)
Copied from CRM [id number]. Topic: Quick Communication - Rx Refill/Question >> Aug 18, 2018  5:33 PM Neomia Dearemaray, Terry Guerra wrote: Medication: amLODipine (NORVASC) 10 MG tablet  Has the patient contacted their pharmacy? Yes.   (Agent: If no, request that the patient contact the pharmacy for the refill.) (Agent: If yes, when and what did the pharmacy advise?) Call office for a refill  Preferred Pharmacy (with phone number or street name): [facility] [phone][id number] [place] Gu[place], [name] - [address] [phone] (Phone) [phone] (Fax)    Agent: Please be advised that RX refills may take up to 3 business days. We ask that you follow-up with your pharmacy.

## 2018-08-18 NOTE — Telephone Encounter (Signed)
Requested medication (s) are due for refill today: yes  Requested medication (s) are on the active medication list: yes  Last refill:  Last refilled by historical provider  Future visit scheduled: yes  Notes to clinic:  Medication last filled by historical provider    Requested Prescriptions  Pending Prescriptions Disp Refills   amLODipine (NORVASC) 10 MG tablet 90 tablet 0    Sig: Take 1 tablet (10 mg total) by mouth daily.     Cardiovascular:  Calcium Channel Blockers Failed - 08/18/2018  6:13 PM      Failed - Last BP in normal range    BP Readings from Last 1 Encounters:  07/27/18 (!) 153/97         Passed - Valid encounter within last 6 months    Recent Outpatient Visits          1 month ago Essential hypertension   [facility], [place]   2 months ago Preoperative clearance   [facility] Stallings, Manus RuddZoe A, MD   1 year ago Preoperative clearance   [facility] Sagardia, Eilleen KempfMiguel Jose, MD   1 year ago Essential hypertension   [facility], Eilleen KempfMiguel Jose, MD   2 years ago Annual physical exam   [facility] [place], [place]      Future Appointments            In 1 month Doristine BosworthStallings, Zoe A, MD [facility]

## 2018-08-18 NOTE — Telephone Encounter (Signed)
Requested medication (s) are due for refill today: not specified  Requested medication (s) are on the active medication list: yes    Last refill: 07/01/18  Future visit scheduled   Yes    10/05/2018    Dr. Creta LevinStallings  Notes to clinic:historical provider  Requested Prescriptions  Pending Prescriptions Disp Refills   amLODipine (NORVASC) 10 MG tablet [Pharmacy Med Name: AMLODIPINE BESYLATE 10 MG TAB] 90 tablet 3    Sig: TAKE 1 TABLET BY MOUTH EVERY DAY     Cardiovascular:  Calcium Channel Blockers Failed - 08/18/2018  3:07 PM      Failed - Last BP in normal range    BP Readings from Last 1 Encounters:  07/27/18 (!) 153/97         Passed - Valid encounter within last 6 months    Recent Outpatient Visits          1 month ago Essential hypertension   [facility], Manus RuddZoe A, MD   2 months ago Preoperative clearance   [facility], Manus RuddZoe A, MD   1 year ago Preoperative clearance   [facility], Eilleen KempfMiguel Jose, MD   1 year ago Essential hypertension   [facility], Eilleen KempfMiguel Jose, MD   2 years ago Annual physical exam   [facility] at [facility] [place], [place], [place]      Future Appointments            In 1 month Doristine BosworthStallings, Zoe A, MD [facility] at [place]

## 2018-08-19 MED ORDER — AMLODIPINE BESYLATE 10 MG PO TABS: 10.0000 mg | ORAL_TABLET | Freq: Every day | ORAL | 0 refills | Status: DC

## 2018-09-01 ENCOUNTER — Ambulatory Visit: Payer: BLUE CROSS/BLUE SHIELD | Admitting: Neurology

## 2018-09-07 DIAGNOSIS — M25461 Effusion, right knee: Secondary | ICD-10-CM | POA: Diagnosis not present

## 2018-10-05 ENCOUNTER — Ambulatory Visit: Payer: BLUE CROSS/BLUE SHIELD | Admitting: Family Medicine

## 2018-10-17 ENCOUNTER — Ambulatory Visit: Payer: Self-pay | Admitting: Nurse Practitioner

## 2018-11-14 ENCOUNTER — Other Ambulatory Visit: Payer: Self-pay | Admitting: Family Medicine

## 2018-11-14 NOTE — Telephone Encounter (Signed)
Requested medication (s) are due for refill today: yes  Requested medication (s) are on the active medication list: yes  Last refill:  08/19/18 for 90 tabs  Future visit scheduled: yes  Notes to clinic:  Calcium channel blockers failed.  Requested Prescriptions  Pending Prescriptions Disp Refills   amLODipine (NORVASC) 10 MG tablet [Pharmacy Med Name: AMLODIPINE BESYLATE 10 MG TAB] 90 tablet 0    Sig: TAKE 1 TABLET BY MOUTH EVERY DAY     Cardiovascular:  Calcium Channel Blockers Failed - 11/14/2018  1:54 AM      Failed - Last BP in normal range    BP Readings from Last 1 Encounters:  07/27/18 (!) 153/97         Passed - Valid encounter within last 6 months    Recent Outpatient Visits          4 months ago Essential hypertension   Primary Care at [facility], Manus Rudd, MD   5 months ago Preoperative clearance   Primary Care at [facility], Manus Rudd, MD   1 year ago Preoperative clearance   Primary Care at [facility], Eilleen Kempf, MD   1 year ago Essential hypertension   Primary Care at [facility], Eilleen Kempf, MD   3 years ago Annual physical exam   Primary Care at [place], [place], [place]      Future Appointments            In 2 weeks Doristine Bosworth, MD Primary Care at [place], [facility]

## 2018-11-14 NOTE — Telephone Encounter (Signed)
Refill request for amlodipine 10 mg #90 with 0 refills approved.  Pt has upcoming appt w/stallings on 12/02/2018. Dgaddy, CMA

## 2018-11-25 ENCOUNTER — Other Ambulatory Visit: Payer: Self-pay | Admitting: *Deleted

## 2018-11-25 DIAGNOSIS — I10 Essential (primary) hypertension: Secondary | ICD-10-CM

## 2018-12-01 ENCOUNTER — Ambulatory Visit: Payer: BLUE CROSS/BLUE SHIELD

## 2018-12-02 ENCOUNTER — Ambulatory Visit: Payer: BLUE CROSS/BLUE SHIELD

## 2018-12-02 ENCOUNTER — Other Ambulatory Visit: Payer: Self-pay

## 2018-12-02 ENCOUNTER — Telehealth (INDEPENDENT_AMBULATORY_CARE_PROVIDER_SITE_OTHER): Payer: BLUE CROSS/BLUE SHIELD | Admitting: Family Medicine

## 2018-12-02 DIAGNOSIS — I10 Essential (primary) hypertension: Secondary | ICD-10-CM | POA: Diagnosis not present

## 2018-12-02 DIAGNOSIS — R351 Nocturia: Secondary | ICD-10-CM | POA: Diagnosis not present

## 2018-12-02 DIAGNOSIS — G4733 Obstructive sleep apnea (adult) (pediatric): Secondary | ICD-10-CM

## 2018-12-02 DIAGNOSIS — Z5181 Encounter for therapeutic drug level monitoring: Secondary | ICD-10-CM

## 2018-12-02 DIAGNOSIS — Z125 Encounter for screening for malignant neoplasm of prostate: Secondary | ICD-10-CM

## 2018-12-02 DIAGNOSIS — E785 Hyperlipidemia, unspecified: Secondary | ICD-10-CM | POA: Diagnosis not present

## 2018-12-02 NOTE — Progress Notes (Signed)
CC- 3 mo f/u for hypertension. Patient have not been taking bp at home. Last office visit check his machine and it was off so quit using his bp machine at home. Having not problems at this time. Patient had lab appt on 4/3 and again 4/10 but can not make it in for lab order. Will stop by the office next week for labs.

## 2018-12-02 NOTE — Progress Notes (Signed)
Telemedicine Encounter- SOAP NOTE Established Patient  This telephone encounter was conducted with the patient's (or proxy's) verbal consent via audio telecommunications: yes/no: Yes Patient was instructed to have this encounter in a suitably private space; and to only have persons present to whom they give permission to participate. In addition, patient identity was confirmed by use of name plus two identifiers (DOB and address).  I discussed the limitations, risks, security and privacy concerns of performing an evaluation and management service by telephone and the availability of in person appointments. I also discussed with the patient that there may be a patient responsible charge related to this service. The patient expressed understanding and agreed to proceed.  I spent a total of TIME; 0 MIN TO 60 MIN: 20 minutes talking with the patient or their proxy.  CC: Blood pressure, nocturia  Subjective   Terry Guerra is a 57 y.o. established patient. Telephone visit today for  HPI  Hypertension: Patient here for follow-up of elevated blood pressure. 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  Blood pressure is well controlled at home. Cardiac symptoms none. Patient denies chest pain, chest pressure/discomfort, claudication, dyspnea and exertional chest pressure/discomfort.  Cardiovascular risk factors: dyslipidemia, hypertension and male gender. Use of agents associated with hypertension: none. History of target organ damage: none. BP Readings from Last 3 Encounters:  07/27/18 (!) 153/97  07/19/18 140/82  07/01/18 138/76   Screen prostate Nocturia He reports that he is waking up 2-3 times per night to use the bathroom He denies family history of prostate cancer He states that he does not have burning with urination  He does not use his CPAP due to cost He has a history of OSA  Dyslipidemia Lab Results  Component Value Date   CHOL  166 06/01/2017   CHOL 224 (H) 12/09/2016   CHOL 251 (H) 11/04/2015   Lab Results  Component Value Date   HDL 36 (L) 06/01/2017   HDL 25 (L) 12/09/2016   HDL 26 (L) 11/04/2015   Lab Results  Component Value Date   LDLCALC 86 06/01/2017   LDLCALC Comment 12/09/2016   LDLCALC NOT CALC 11/04/2015   Lab Results  Component Value Date   TRIG 219 (H) 06/01/2017   TRIG 511 (H) 12/09/2016   TRIG 557 (H) 11/04/2015   Lab Results  Component Value Date   CHOLHDL 4.6 06/01/2017   CHOLHDL 9.0 (H) 12/09/2016   CHOLHDL 9.7 (H) 11/04/2015   Patient is taking his pravachol He denies muscle cramps He avoids red meats  Patient Active Problem List   Diagnosis Date Noted  . Primary localized osteoarthritis of knee 07/26/2018  . Primary osteoarthritis of right knee 07/04/2018  . Primary osteoarthritis of knee 09/07/2017  . Unilateral primary osteoarthritis, left knee 08/09/2017  . OSA (obstructive sleep apnea) 08/09/2017  . Dyslipidemia 12/09/2016  . HTN (hypertension) 11/10/2011    Past Medical History:  Diagnosis Date  . Abrasion of shoulder, left   . Arthritis    knees  . Hypertension   . Shoulder pain, left    had cortisone shots with reported relief   . Sleep apnea    no CPAP    Current Outpatient Medications  Medication Sig Dispense Refill  . amLODipine (NORVASC) 10 MG tablet TAKE 1 TABLET BY MOUTH EVERY DAY 90 tablet 0  . lisinopril (PRINIVIL,ZESTRIL) 10 MG tablet Take 1 tablet (10 mg total) by mouth daily.  90 tablet 3  . pravastatin (PRAVACHOL) 40 MG tablet TAKE 1 TABLET BY MOUTH EVERY DAY (Patient taking differently: Take 40 mg by mouth daily. ) 90 tablet 1   No current facility-administered medications for this visit.     No Known Allergies  Social History   Socioeconomic History  . Marital status: Divorced    Spouse name: Erskine SquibbJane   . Number of children: 1  . Years of education: 3812  . Highest education level: Not on file  Occupational History  . Occupation:  Music therapistCarpenter   Social Needs  . Financial resource strain: Not on file  . Food insecurity:    Worry: Not on file    Inability: Not on file  . Transportation needs:    Medical: Not on file    Non-medical: Not on file  Tobacco Use  . Smoking status: Former Games developermoker  . Smokeless tobacco: Never Used  . Tobacco comment: Quit 1999  Substance and Sexual Activity  . Alcohol use: Yes    Alcohol/week: 12.0 standard drinks    Types: 12 Standard drinks or equivalent per week  . Drug use: No  . Sexual activity: Yes    Birth control/protection: None  Lifestyle  . Physical activity:    Days per week: Not on file    Minutes per session: Not on file  . Stress: Not on file  Relationships  . Social connections:    Talks on phone: Not on file    Gets together: Not on file    Attends religious service: Not on file    Active member of club or organization: Not on file    Attends meetings of clubs or organizations: Not on file    Relationship status: Not on file  . Intimate partner violence:    Fear of current or ex partner: Not on file    Emotionally abused: Not on file    Physically abused: Not on file    Forced sexual activity: Not on file  Other Topics Concern  . Not on file  Social History Narrative   Drinks 1 cup of coffee daily     ROS Review of Systems  Constitutional: Negative for activity change, appetite change, chills and fever.  HENT: Negative for congestion, nosebleeds, trouble swallowing and voice change.   Respiratory: Negative for cough, shortness of breath and wheezing.   Gastrointestinal: Negative for diarrhea, nausea and vomiting.  Genitourinary: Negative for difficulty urinating, dysuria, flank pain and hematuria.  Musculoskeletal: Negative for back pain, joint swelling and neck pain.  Neurological: Negative for dizziness, speech difficulty, light-headedness and numbness.  See HPI. All other review of systems negative.   Objective   Vitals as reported by the patient:  There were no vitals filed for this visit.  Diagnoses and all orders for this visit:  Dyslipidemia-  Discussed continuing pravachol  Pt has lab visit   Essential hypertension- Pt to bring in new cuff for bp check Will chek bp on 12/07/18 at his nurse visit  OSA (obstructive sleep apnea)- could contribute to his nocturia  Encounter for medication monitoring  Nocturia- discussed causes, will work up with labs  -     PSA; Future -     Hemoglobin A1c; Future -     POCT urinalysis dipstick; Future  Screening for malignant neoplasm of prostate -     PSA; Future     I discussed the assessment and treatment plan with the patient. The patient was provided an opportunity to ask  questions and all were answered. The patient agreed with the plan and demonstrated an understanding of the instructions.   The patient was advised to call back or seek an in-person evaluation if the symptoms worsen or if the condition fails to improve as anticipated.  I provided 20 minutes of non-face-to-face time during this encounter.  Doristine Bosworth, MD  Primary Care at [facility]

## 2018-12-02 NOTE — Patient Instructions (Signed)
° ° ° °  If you have lab work done today you will be contacted with your lab results within the next 2 weeks.  If you have not heard from us then please contact us. The fastest way to get your results is to register for My Chart. ° ° °IF you received an x-ray today, you will receive an invoice from [facility]. Please contact [facility] at [phone] with questions or concerns regarding your invoice.  ° °IF you received labwork today, you will receive an invoice from [facility]. Please contact [facility] at [phone] with questions or concerns regarding your invoice.  ° °Our billing staff will not be able to assist you with questions regarding bills from these companies. ° °You will be contacted with the lab results as soon as they are available. The fastest way to get your results is to activate your My Chart account. Instructions are located on the last page of this paperwork. If you have not heard from us regarding the results in 2 weeks, please contact this office. °  ° ° ° °

## 2018-12-07 ENCOUNTER — Ambulatory Visit: Payer: BLUE CROSS/BLUE SHIELD

## 2018-12-13 ENCOUNTER — Telehealth: Payer: Self-pay | Admitting: Family Medicine

## 2018-12-13 ENCOUNTER — Ambulatory Visit: Payer: BLUE CROSS/BLUE SHIELD

## 2018-12-13 NOTE — Telephone Encounter (Signed)
12/13/2018 - PATIENT HAD A NURSES VISIT TO HAVE HIS LABS DRAWN ON Tuesday (12/13/2018) AT 9:00am. HE WAS A NO-SHOW. I CALLED TO SEE  IF WE COULD RESCHEDULE BUT I HAD TO LEAVE A VOICE MAIL. DR. Creta Levin ORDERED THESE LABS AND THE ORDERS ARE IN. MBC

## 2019-01-30 DIAGNOSIS — M7581 Other shoulder lesions, right shoulder: Secondary | ICD-10-CM | POA: Diagnosis not present

## 2019-04-25 ENCOUNTER — Other Ambulatory Visit: Payer: Self-pay | Admitting: Family Medicine

## 2019-04-25 DIAGNOSIS — E785 Hyperlipidemia, unspecified: Secondary | ICD-10-CM

## 2019-04-25 NOTE — Telephone Encounter (Signed)
Requested medication (s) are due for refill today: yes  Requested medication (s) are on the active medication list: yes  Last refill:  01/25/2019  Future visit scheduled: no Notes to clinic:  Review for refill   Requested Prescriptions  Pending Prescriptions Disp Refills   pravastatin (PRAVACHOL) 40 MG tablet [Pharmacy Med Name: PRAVASTATIN SODIUM 40 MG TAB] 90 tablet 1    Sig: TAKE 1 TABLET BY MOUTH EVERY DAY     Cardiovascular:  Antilipid - Statins Failed - 04/25/2019  1:35 AM      Failed - Total Cholesterol in normal range and within 360 days    Cholesterol, Total  Date Value Ref Range Status  06/01/2017 166 100 - 199 mg/dL Final         Failed - LDL in normal range and within 360 days    LDL Calculated  Date Value Ref Range Status  06/01/2017 86 0 - 99 mg/dL Final         Failed - HDL in normal range and within 360 days    HDL  Date Value Ref Range Status  06/01/2017 36 (L) >39 mg/dL Final    Comment:    **Effective June 14, 2017, HDL Cholesterol**   reference interval will be changing to:                                   Male        Male                               4 - [phone]   82 - [phone]          Failed - Triglycerides in normal range and within 360 days    Triglycerides  Date Value Ref Range Status  06/01/2017 219 (H) 0 - 149 mg/dL Final         Passed - Patient is not pregnant      Passed - Valid encounter within last 12 months    Recent Outpatient Visits          9 months ago Essential hypertension   Primary Care at [facility], Arlie Solomons, MD   10 months ago Preoperative clearance   Primary Care at [facility], Arlie Solomons, MD   1 year ago Preoperative clearance   Primary Care at [facility], Ines Bloomer, MD   2 years ago Essential hypertension   Primary Care at [place], Ines Bloomer, MD   3 years ago Annual physical exam   Primary Care at [place], [place], [place]

## 2019-05-23 ENCOUNTER — Other Ambulatory Visit: Payer: Self-pay | Admitting: Family Medicine

## 2019-05-23 DIAGNOSIS — I10 Essential (primary) hypertension: Secondary | ICD-10-CM

## 2019-05-23 NOTE — Telephone Encounter (Signed)
No further refills without office visit 

## 2019-05-23 NOTE — Telephone Encounter (Signed)
Forwarding medication refill request to the clinical pool for review. 

## 2019-06-15 ENCOUNTER — Other Ambulatory Visit: Payer: Self-pay | Admitting: Family Medicine

## 2019-06-16 MED ORDER — AMLODIPINE BESYLATE 10 MG PO TABS: 10.0000 mg | ORAL_TABLET | Freq: Every day | ORAL | 0 refills | Status: DC

## 2019-06-19 ENCOUNTER — Ambulatory Visit: Payer: BC Managed Care – PPO | Admitting: Family Medicine

## 2019-08-30 ENCOUNTER — Other Ambulatory Visit: Payer: Self-pay | Admitting: Family Medicine

## 2019-08-30 ENCOUNTER — Telehealth: Payer: Self-pay | Admitting: *Deleted

## 2019-08-30 DIAGNOSIS — I10 Essential (primary) hypertension: Secondary | ICD-10-CM

## 2019-08-30 NOTE — Telephone Encounter (Signed)
30 days sent in  Please schedule appointment 

## 2019-08-30 NOTE — Telephone Encounter (Signed)
Lvm for pt to call back. 

## 2019-08-30 NOTE — Telephone Encounter (Signed)
Requested medication (s) are due for refill today: yes  Requested medication (s) are on the active medication list: yes  Last refill:  05/23/2019  Future visit scheduled: no  Notes to clinic:  LOV-12/02/2018 Overdue for office visit  Review for refill   Requested Prescriptions  Pending Prescriptions Disp Refills   lisinopril (ZESTRIL) 10 MG tablet [Pharmacy Med Name: LISINOPRIL 10 MG TABLET] 90 tablet 0    Sig: TAKE 1 TABLET BY MOUTH EVERY DAY      Cardiovascular:  ACE Inhibitors Failed - 08/30/2019  1:05 AM      Failed - Cr in normal range and within 180 days    Creat  Date Value Ref Range Status  11/04/2015 0.75 0.70 - 1.33 mg/dL Final   Creatinine, Ser  Date Value Ref Range Status  07/01/2018 0.93 0.76 - 1.27 mg/dL Final          Failed - K in normal range and within 180 days    Potassium  Date Value Ref Range Status  07/01/2018 4.4 3.5 - 5.2 mmol/L Final          Failed - Last BP in normal range    BP Readings from Last 1 Encounters:  07/27/18 (!) 153/97          Failed - Valid encounter within last 6 months    Recent Outpatient Visits           9 months ago Dyslipidemia   Primary Care at [facility], Manus Rudd, MD   1 year ago Essential hypertension   Primary Care at [facility], Manus Rudd, MD   1 year ago Preoperative clearance   Primary Care at [facility], Manus Rudd, MD   2 years ago Preoperative clearance   Primary Care at [facility], MD   2 years ago Essential hypertension   Primary Care at [place], Eilleen Kempf, MD              Passed - Patient is not pregnant

## 2019-09-12 ENCOUNTER — Other Ambulatory Visit: Payer: Self-pay | Admitting: Family Medicine

## 2019-09-12 NOTE — Telephone Encounter (Signed)
Requested medication (s) are due for refill today: yes  Requested medication (s) are on the active medication list: yes  Last refill:  06/16/2019  Future visit scheduled: no  Notes to clinic:  no valid encounter in last 6 months    Requested Prescriptions  Pending Prescriptions Disp Refills   amLODipine (NORVASC) 10 MG tablet [Pharmacy Med Name: AMLODIPINE BESYLATE 10 MG TAB] 90 tablet 0    Sig: TAKE 1 TABLET BY MOUTH EVERY DAY      Cardiovascular:  Calcium Channel Blockers Failed - 09/12/2019  1:03 AM      Failed - Last BP in normal range    BP Readings from Last 1 Encounters:  07/27/18 (!) 153/97          Failed - Valid encounter within last 6 months    Recent Outpatient Visits           9 months ago Dyslipidemia   Primary Care at [facility], Manus Rudd, MD   1 year ago Essential hypertension   Primary Care at [facility], Manus Rudd, MD   1 year ago Preoperative clearance   Primary Care at [facility], Manus Rudd, MD   2 years ago Preoperative clearance   Primary Care at [facility], Eilleen Kempf, MD   2 years ago Essential hypertension   Primary Care at [facility], [place], MD

## 2019-10-04 ENCOUNTER — Other Ambulatory Visit: Payer: Self-pay | Admitting: Family Medicine

## 2019-10-04 ENCOUNTER — Other Ambulatory Visit: Payer: Self-pay | Admitting: Registered Nurse

## 2019-10-04 DIAGNOSIS — I10 Essential (primary) hypertension: Secondary | ICD-10-CM

## 2019-10-04 NOTE — Telephone Encounter (Signed)
No further refills without office visit 

## 2019-10-04 NOTE — Telephone Encounter (Signed)
Please schedule 6 month f/u in order to get any further refills

## 2019-10-04 NOTE — Telephone Encounter (Signed)
Called patient to schedule. There was no answer and I left a voicemail to call back

## 2019-10-04 NOTE — Telephone Encounter (Signed)
Requested medication (s) are due for refill today:   Yes  Requested medication (s) are on the active medication list:   Yes  Future visit scheduled:   No   Last ordered: 08/30/2019  #30  0 refills.       30 day courtesy refill was given in Jan.   Over due for an OV.   Returned for provider review for refill.   Requested Prescriptions  Pending Prescriptions Disp Refills   lisinopril (ZESTRIL) 10 MG tablet [Pharmacy Med Name: LISINOPRIL 10 MG TABLET] 30 tablet 0    Sig: TAKE 1 TABLET BY MOUTH EVERY DAY      Cardiovascular:  ACE Inhibitors Failed - 10/04/2019  8:32 AM      Failed - Cr in normal range and within 180 days    Creat  Date Value Ref Range Status  11/04/2015 0.75 0.70 - 1.33 mg/dL Final   Creatinine, Ser  Date Value Ref Range Status  07/01/2018 0.93 0.76 - 1.27 mg/dL Final          Failed - K in normal range and within 180 days    Potassium  Date Value Ref Range Status  07/01/2018 4.4 3.5 - 5.2 mmol/L Final          Failed - Last BP in normal range    BP Readings from Last 1 Encounters:  07/27/18 (!) 153/97          Failed - Valid encounter within last 6 months    Recent Outpatient Visits           10 months ago Dyslipidemia   Primary Care at [facility], Manus Rudd, MD   1 year ago Essential hypertension   Primary Care at [facility], Manus Rudd, MD   1 year ago Preoperative clearance   Primary Care at [facility], Manus Rudd, MD   2 years ago Preoperative clearance   Primary Care at [facility], MD   2 years ago Essential hypertension   Primary Care at [place], [place], MD              Passed - Patient is not pregnant

## 2019-10-18 ENCOUNTER — Other Ambulatory Visit: Payer: Self-pay | Admitting: Family Medicine

## 2019-10-18 DIAGNOSIS — I10 Essential (primary) hypertension: Secondary | ICD-10-CM

## 2019-10-18 NOTE — Telephone Encounter (Signed)
Requested medication (s) are due for refill today: yes  Requested medication (s) are on the active medication list: yes  Last refill: 10/04/19 #30  0 refills   Future visit scheduled: No needs appointment for refill per last fill  Notes to clinic:  Pharmacy request for 90 day  Patient needs appointment.    Requested Prescriptions  Pending Prescriptions Disp Refills   lisinopril (ZESTRIL) 10 MG tablet [Pharmacy Med Name: LISINOPRIL 10 MG TABLET] 90 tablet 1    Sig: TAKE 1 TABLET BY MOUTH EVERY DAY      Cardiovascular:  ACE Inhibitors Failed - 10/18/2019 10:18 AM      Failed - Cr in normal range and within 180 days    Creat  Date Value Ref Range Status  11/04/2015 0.75 0.70 - 1.33 mg/dL Final   Creatinine, Ser  Date Value Ref Range Status  07/01/2018 0.93 0.76 - 1.27 mg/dL Final          Failed - K in normal range and within 180 days    Potassium  Date Value Ref Range Status  07/01/2018 4.4 3.5 - 5.2 mmol/L Final          Failed - Last BP in normal range    BP Readings from Last 1 Encounters:  07/27/18 (!) 153/97          Failed - Valid encounter within last 6 months    Recent Outpatient Visits           10 months ago Dyslipidemia   Primary Care at [facility], Manus Rudd, MD   1 year ago Essential hypertension   Primary Care at [facility], Manus Rudd, MD   1 year ago Preoperative clearance   Primary Care at [facility], Manus Rudd, MD   2 years ago Preoperative clearance   Primary Care at [facility], MD   2 years ago Essential hypertension   Primary Care at [place], [place], MD              Passed - Patient is not pregnant

## 2019-12-14 IMAGING — DX DG KNEE 1-2V PORT*L*
2 series · 2 of 2 positions shown · non-contrast
Comparison: Radiographs September 13, 2013.

CLINICAL DATA: Status post left hemiarthroplasty.

EXAM:
PORTABLE LEFT KNEE - 1-2 VIEW

[knee ap]
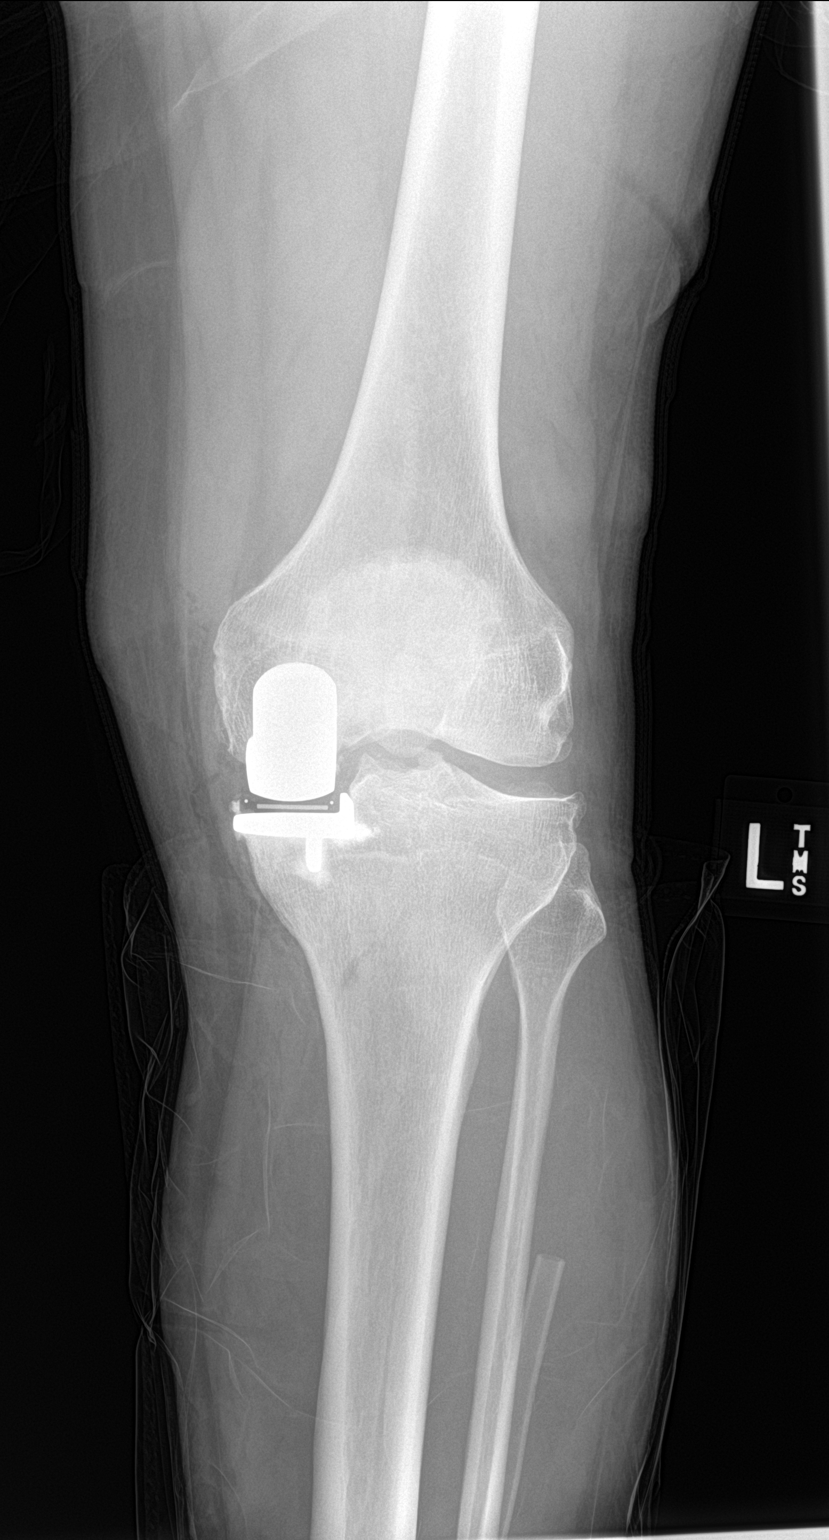

[knee lat]
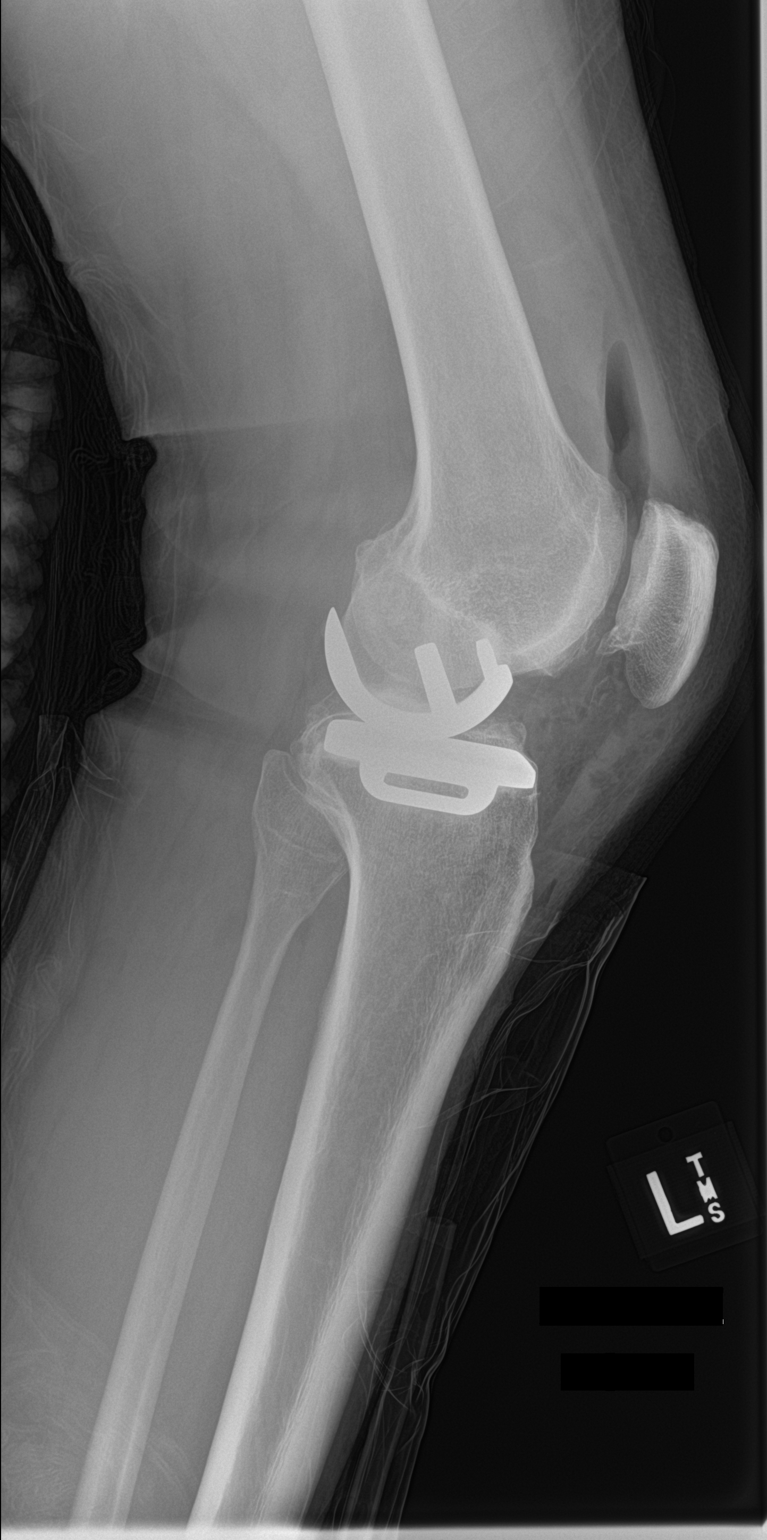

[2 of 2 positions shown; findings below may reference images not displayed]

FINDINGS: Status post left medial hemiarthroplasty. The medial femoral
condylar and medial tibial plateau components are well situated. No
fracture or dislocation is noted. Expected postoperative changes are
noted in the soft tissues anteriorly.
IMPRESSION: Status post left medial hemiarthroplasty.

## 2020-04-22 ENCOUNTER — Telehealth: Payer: BLUE CROSS/BLUE SHIELD | Admitting: Emergency Medicine

## 2020-04-22 DIAGNOSIS — R21 Rash and other nonspecific skin eruption: Secondary | ICD-10-CM

## 2020-04-22 MED ORDER — PERMETHRIN 5 % EX CREA: TOPICAL_CREAM | CUTANEOUS | 1 refills | Status: DC

## 2020-04-22 MED ORDER — TRIAMCINOLONE ACETONIDE 0.1 % EX CREA: 1.0000 "application " | TOPICAL_CREAM | Freq: Two times a day (BID) | CUTANEOUS | 0 refills | Status: DC

## 2020-04-22 NOTE — Progress Notes (Signed)
E Visit for Rash  We are sorry that you are not feeling well. Here is how we plan to help!  I have prescribed Permethrin Cream, Use as directed.  I have also prescribed Kenalog cream for itching. If you have Scabies, even after you have treated them, the itching can last for weeks afterward.  It is very important that you was all of your clothing and linens in the hottest water possible.   HOME CARE:   Take cool showers and avoid direct sunlight.  Apply cool compress or wet dressings.  Take a bath in an oatmeal bath.  Sprinkle content of one Aveeno packet under running faucet with comfortably warm water.  Bathe for 15-20 minutes, 1-2 times daily.  Pat dry with a towel. Do not rub the rash.  Use hydrocortisone cream.  Take an antihistamine like Benadryl for widespread rashes that itch.  The adult dose of Benadryl is 25-50 mg by mouth 4 times daily.  Caution:  This type of medication may cause sleepiness.  Do not drink alcohol, drive, or operate dangerous machinery while taking antihistamines.  Do not take these medications if you have prostate enlargement.  Read package instructions thoroughly on all medications that you take.  GET HELP RIGHT AWAY IF:   Symptoms don't go away after treatment.  Severe itching that persists.  If you rash spreads or swells.  If you rash begins to smell.  If it blisters and opens or develops a yellow-brown crust.  You develop a fever.  You have a sore throat.  You become short of breath.  MAKE SURE YOU:  Understand these instructions. Will watch your condition. Will get help right away if you are not doing well or get worse.  Thank you for choosing an e-visit. Your e-visit answers were reviewed by a board certified advanced clinical practitioner to complete your personal care plan. Depending upon the condition, your plan could have included both over the counter or prescription medications. Please review your pharmacy choice. Be sure that  the pharmacy you have chosen is open so that you can pick up your prescription now.  If there is a problem you may message your provider in MyChart to have the prescription routed to another pharmacy. Your safety is important to [place]. If you have drug allergies check your prescription carefully.  For the next 24 hours, you can use MyChart to ask questions about today's visit, request a non-urgent call back, or ask for a work or school excuse from your e-visit provider. You will get an email in the next two days asking about your experience. I hope that your e-visit has been valuable and will speed your recovery.     **Please do not respond to this message unless you have follow up questions.** Greater than 5 but less than 10 minutes spent researching, coordinating, and implementing care for this patient today

## 2020-10-31 IMAGING — DX DG KNEE 1-2V PORT*R*
2 series · 2 of 2 positions shown · non-contrast
Comparison: None.

CLINICAL DATA: S/p RIGHT knee hemiarthroplasty

EXAM:
PORTABLE RIGHT KNEE - 1-2 VIEW

[knee ap]
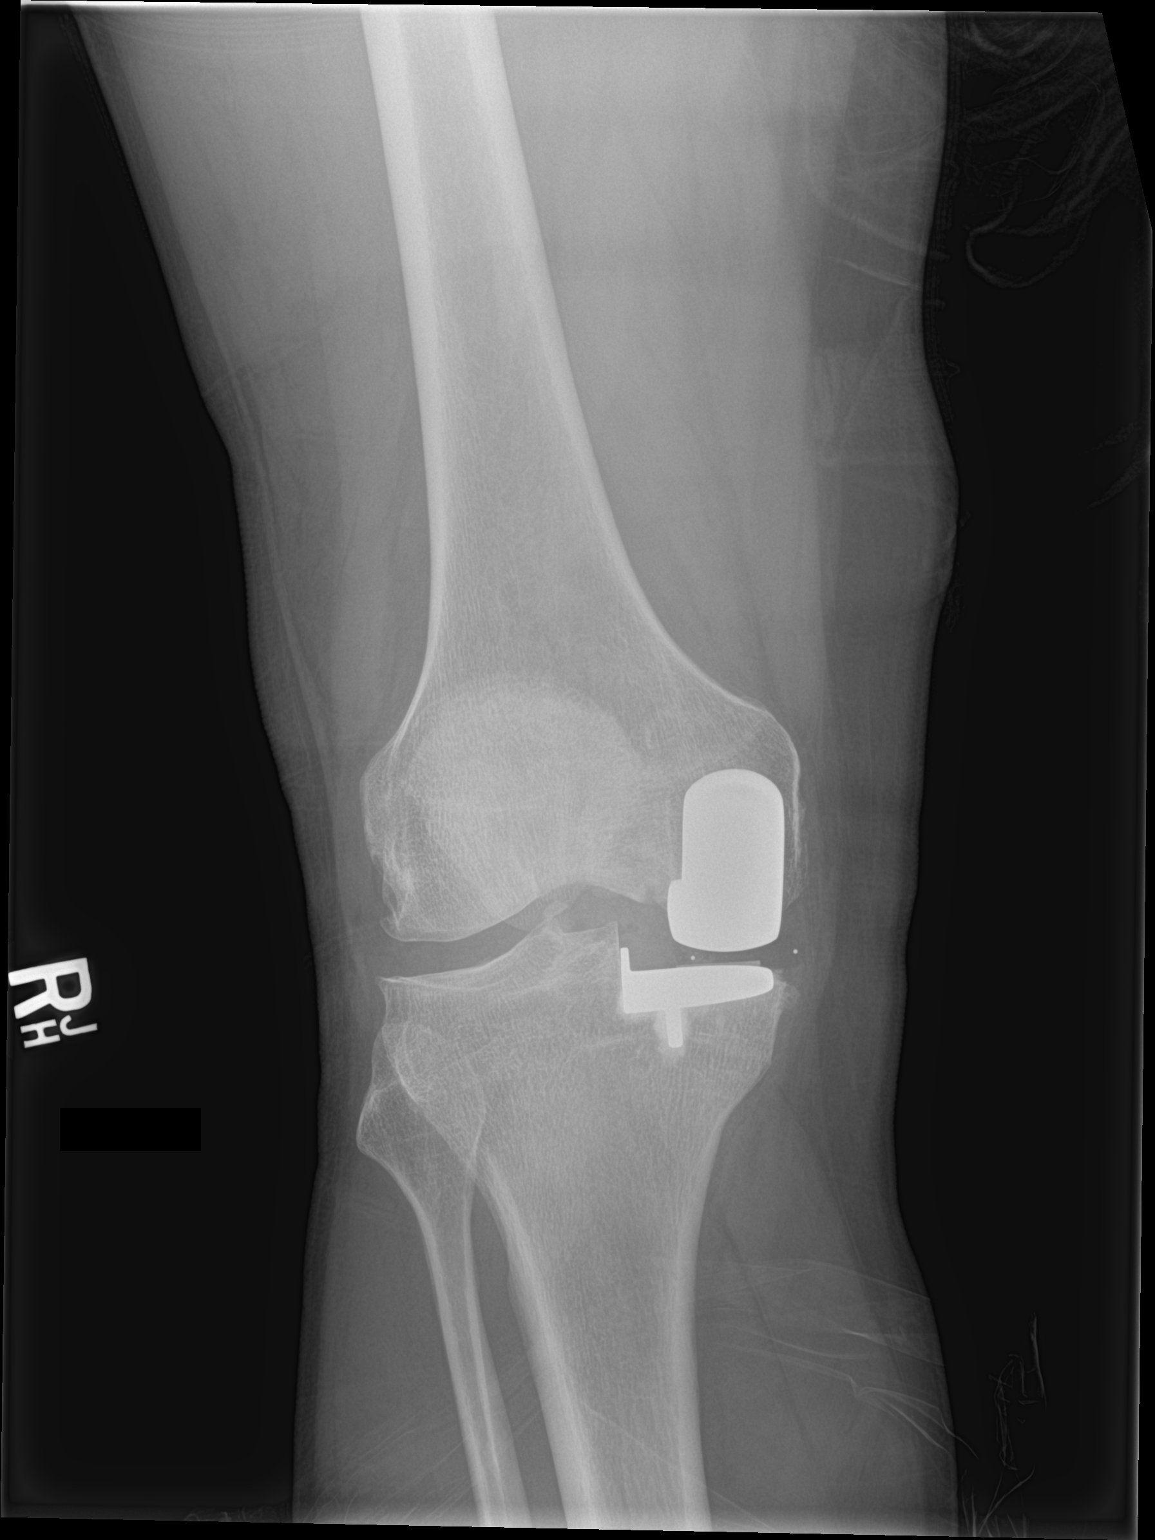

[knee lat]
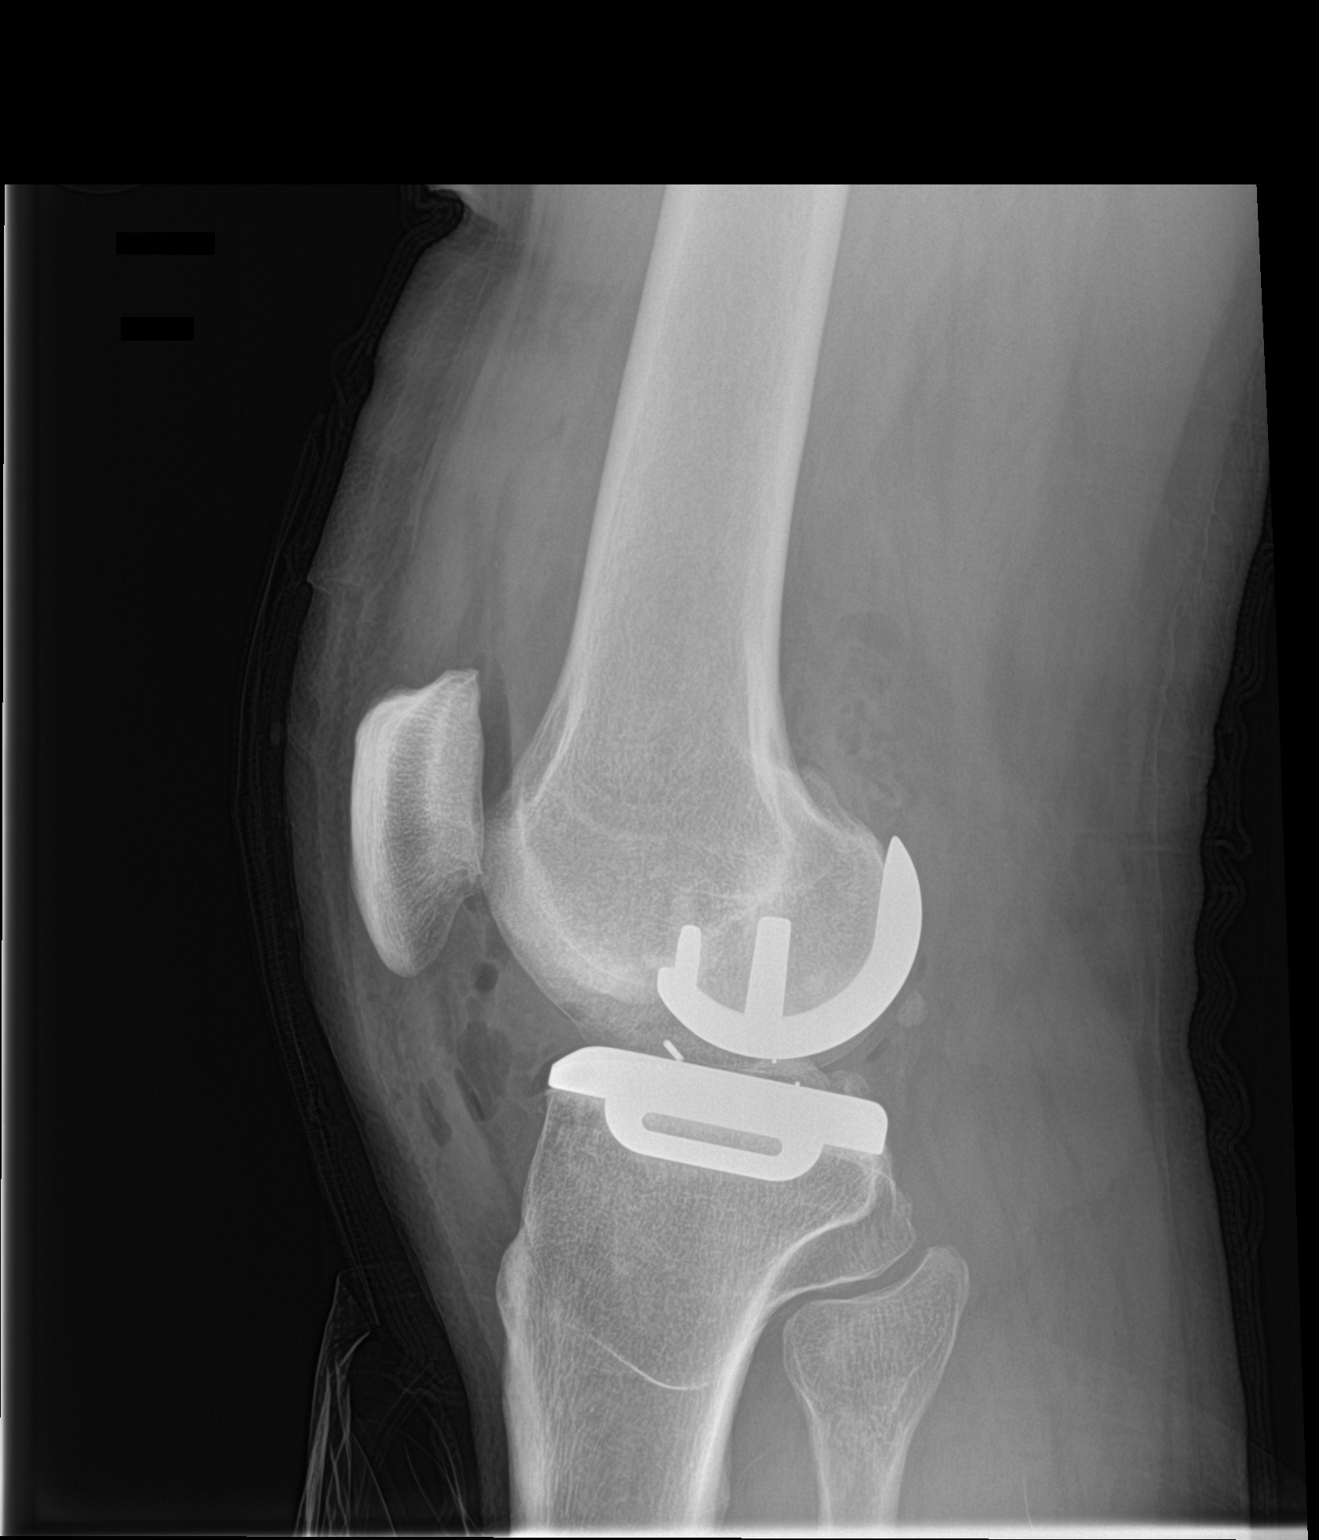

[2 of 2 positions shown; findings below may reference images not displayed]

FINDINGS: RIGHT knee MEDIAL compartment hemiarthroplasty changes identified.
The spacer is medially situated relative to the tibial tray, and
slightly medially situated relative to the unicondylar component.

Soft tissue postoperative changes are identified.
IMPRESSION: Medially situated spacer relative to the tibial tray and unicondylar
component of MEDIAL compartment hemiarthroplasty.

## 2020-12-17 ENCOUNTER — Other Ambulatory Visit: Payer: Self-pay | Admitting: Orthopaedic Surgery

## 2020-12-17 DIAGNOSIS — M25512 Pain in left shoulder: Secondary | ICD-10-CM

## 2020-12-25 ENCOUNTER — Ambulatory Visit
Admission: RE | Admit: 2020-12-25 | Discharge: 2020-12-25 | Disposition: A | Discharge status: Home / Self Care | Payer: 59 | Source: Ambulatory Visit | Attending: Orthopaedic Surgery | Admitting: Orthopaedic Surgery

## 2020-12-25 DIAGNOSIS — M25512 Pain in left shoulder: Secondary | ICD-10-CM

## 2021-09-17 DIAGNOSIS — M19012 Primary osteoarthritis, left shoulder: Secondary | ICD-10-CM | POA: Diagnosis not present

## 2021-09-29 ENCOUNTER — Encounter (HOSPITAL_BASED_OUTPATIENT_CLINIC_OR_DEPARTMENT_OTHER): Payer: Self-pay | Admitting: Emergency Medicine

## 2021-09-29 ENCOUNTER — Encounter: Payer: Self-pay | Admitting: Emergency Medicine

## 2021-09-29 ENCOUNTER — Ambulatory Visit
Admission: EM | Admit: 2021-09-29 | Discharge: 2021-09-29 | Disposition: A | Discharge status: Other Acute Inpatient Hospital | Payer: 59

## 2021-09-29 ENCOUNTER — Telehealth: Payer: 59 | Admitting: Physician Assistant

## 2021-09-29 ENCOUNTER — Emergency Department (HOSPITAL_BASED_OUTPATIENT_CLINIC_OR_DEPARTMENT_OTHER)
Admission: EM | Admit: 2021-09-29 | Discharge: 2021-09-30 | Disposition: A | Discharge status: Home / Self Care | Payer: 59 | Attending: Emergency Medicine | Admitting: Emergency Medicine

## 2021-09-29 ENCOUNTER — Other Ambulatory Visit: Payer: Self-pay

## 2021-09-29 DIAGNOSIS — Z87891 Personal history of nicotine dependence: Secondary | ICD-10-CM | POA: Diagnosis not present

## 2021-09-29 DIAGNOSIS — I10 Essential (primary) hypertension: Secondary | ICD-10-CM

## 2021-09-29 DIAGNOSIS — Z760 Encounter for issue of repeat prescription: Secondary | ICD-10-CM | POA: Insufficient documentation

## 2021-09-29 DIAGNOSIS — Z96653 Presence of artificial knee joint, bilateral: Secondary | ICD-10-CM | POA: Diagnosis not present

## 2021-09-29 DIAGNOSIS — I161 Hypertensive emergency: Secondary | ICD-10-CM | POA: Diagnosis not present

## 2021-09-29 LAB — CBC WITH DIFFERENTIAL/PLATELET
Abs Immature Granulocytes: 0.04 10*3/uL (ref 0.00–0.07)
Basophils Absolute: 0.1 10*3/uL (ref 0.0–0.1)
Basophils Relative: 1 %
Eosinophils Absolute: 0.1 10*3/uL (ref 0.0–0.5)
Eosinophils Relative: 1 %
HCT: 44.4 % (ref 39.0–52.0)
Hemoglobin: 14.8 g/dL (ref 13.0–17.0)
Immature Granulocytes: 0 %
Lymphocytes Relative: 15 %
Lymphs Abs: 1.5 10*3/uL (ref 0.7–4.0)
MCH: 29.4 pg (ref 26.0–34.0)
MCHC: 33.3 g/dL (ref 30.0–36.0)
MCV: 88.1 fL (ref 80.0–100.0)
Monocytes Absolute: 0.8 10*3/uL (ref 0.1–1.0)
Monocytes Relative: 8 %
Neutro Abs: 7.3 10*3/uL (ref 1.7–7.7)
Neutrophils Relative %: 75 %
Platelets: 279 10*3/uL (ref 150–400)
RBC: 5.04 MIL/uL (ref 4.22–5.81)
RDW: 13.2 % (ref 11.5–15.5)
WBC: 9.7 10*3/uL (ref 4.0–10.5)
nRBC: 0 % (ref 0.0–0.2)

## 2021-09-29 LAB — BASIC METABOLIC PANEL
Anion gap: 10 (ref 5–15)
BUN: 17 mg/dL (ref 8–23)
CO2: 24 mmol/L (ref 22–32)
Calcium: 9.5 mg/dL (ref 8.9–10.3)
Chloride: 102 mmol/L (ref 98–111)
Creatinine, Ser: 0.79 mg/dL (ref 0.61–1.24)
GFR, Estimated: >60 mL/min (ref >60)
Glucose, Bld: 92 mg/dL (ref 70–99)
Potassium: 3.7 mmol/L (ref 3.5–5.1)
Sodium: 136 mmol/L (ref 135–145)

## 2021-09-29 MED ORDER — LISINOPRIL 10 MG PO TABS: 10.0000 mg | ORAL_TABLET | Freq: Every day | ORAL | 0 refills | Status: AC

## 2021-09-29 MED ORDER — LISINOPRIL 10 MG PO TABS
10.0000 mg | ORAL_TABLET | Freq: Once | ORAL | Status: AC
  Administered 2021-09-29: 10 mg via ORAL
  Filled 2021-09-29 (×2): qty 1

## 2021-09-29 MED ORDER — AMLODIPINE BESYLATE 10 MG PO TABS: 10.0000 mg | ORAL_TABLET | Freq: Every day | ORAL | 0 refills | Status: AC

## 2021-09-29 MED ORDER — AMLODIPINE BESYLATE 5 MG PO TABS
10.0000 mg | ORAL_TABLET | Freq: Once | ORAL | Status: AC
  Administered 2021-09-29: 10 mg via ORAL
  Filled 2021-09-29: qty 2

## 2021-09-29 NOTE — ED Provider Notes (Signed)
[facility]    CSN: [id number] Arrival date & time: 09/29/21  1859      History   Chief Complaint Chief Complaint  Patient presents with   High Blood Pressure    HPI Terry Guerra is a 61 y.o. male.   Patient presents today for concerns for high blood pressure.  He reports that he took his blood pressure at home with a home cuff and it was [id number] systolic so he had a video visit today.  Video visit healthcare provider advised him to follow-up for further evaluation in person.  He reports that he took blood pressure medication approximately 1 year ago.  He took amlodipine and lisinopril.  He stopped taking medication simply because he stopped going to PCP.  Reports that he has had "some head pressure" and some blurred vision over the past few days.  Denies chest pain, shortness of breath, nausea, vomiting, dizziness.    Past Medical History:  Diagnosis Date   Abrasion of shoulder, left    Arthritis    knees   Hypertension    Shoulder pain, left    had cortisone shots with reported relief    Sleep apnea    no CPAP    Patient Active Problem List   Diagnosis Date Noted   Primary localized osteoarthritis of knee 07/26/2018   Primary osteoarthritis of right knee 07/04/2018   Primary osteoarthritis of knee 09/07/2017   Unilateral primary osteoarthritis, left knee 08/09/2017   OSA (obstructive sleep apnea) 08/09/2017   Dyslipidemia 12/09/2016   HTN (hypertension) 11/10/2011    Past Surgical History:  Procedure Laterality Date   COLONOSCOPY     HERNIA REPAIR     NASAL SEPTUM SURGERY     PARTIAL KNEE ARTHROPLASTY Left 09/07/2017   Procedure: UNICOMPARTMENTAL KNEE;  Surgeon: Renette Butters, MD;  Location: [facility];  Service: Orthopedics;  Laterality: Left;   PARTIAL KNEE ARTHROPLASTY Right 07/26/2018   Procedure: UNICOMPARTMENTAL RIGHT KNEE;  Surgeon: Renette Butters, MD;  Location: [facility];  Service: Orthopedics;  Laterality: Right;       Home Medications     Prior to Admission medications   Not on File    Family History Family History  Problem Relation Age of Onset   Cancer Mother    Diabetes Father    Allergies Brother     Social History Social History   Tobacco Use   Smoking status: Former   Smokeless tobacco: Never   Tobacco comments:    Quit 1999  Vaping Use   Vaping Use: Never used  Substance Use Topics   Alcohol use: Yes    Alcohol/week: 12.0 standard drinks    Types: 12 Standard drinks or equivalent per week   Drug use: No     Allergies   Patient has no known allergies.   Review of Systems Review of Systems Per HPI  Physical Exam Triage Vital Signs ED Triage Vitals  Enc Vitals Group     BP 09/29/21 1923 (!) 205/125     Pulse Rate 09/29/21 1923 96     Resp 09/29/21 1923 20     Temp 09/29/21 1923 97.8 F (36.6 C)     Temp Source 09/29/21 1923 Oral     SpO2 09/29/21 1923 95 %     Weight 09/29/21 1924 230 lb (104.3 kg)     Height 09/29/21 1924 5\' 8"  (1.727 m)     Head Circumference --      Peak Flow --  Pain Score 09/29/21 1924 0     Pain Loc --      Pain Edu? --      Excl. in [place]? --    No data found.  Updated Vital Signs BP (!) 205/125 (BP Location: Left Arm)    Pulse 96    Temp 97.8 F (36.6 C) (Oral)    Resp 20    Ht 5\' 8"  (1.727 m)    Wt 230 lb (104.3 kg)    SpO2 95%    BMI 34.97 kg/m   Visual Acuity Right Eye Distance:   Left Eye Distance:   Bilateral Distance:    Right Eye Near:   Left Eye Near:    Bilateral Near:     Physical Exam Constitutional:      General: He is not in acute distress.    Appearance: Normal appearance. He is not toxic-appearing or diaphoretic.  HENT:     Head: Normocephalic and atraumatic.  Eyes:     Extraocular Movements: Extraocular movements intact.     Conjunctiva/sclera: Conjunctivae normal.  Cardiovascular:     Rate and Rhythm: Normal rate and regular rhythm.     Pulses: Normal pulses.     Heart sounds: Normal heart sounds.  Pulmonary:      Effort: Pulmonary effort is normal. No respiratory distress.     Breath sounds: Normal breath sounds.  Neurological:     General: No focal deficit present.     Mental Status: He is alert and oriented to person, place, and time. Mental status is at baseline.     Cranial Nerves: Cranial nerves 2-12 are intact.     Sensory: Sensation is intact.     Motor: Motor function is intact.     Coordination: Coordination is intact.     Gait: Gait is intact.  Psychiatric:        Mood and Affect: Mood normal.        Behavior: Behavior normal.        Thought Content: Thought content normal.        Judgment: Judgment normal.     UC Treatments / Results  Labs (all labs ordered are listed, but only abnormal results are displayed) Labs Reviewed - No data to display  EKG   Radiology No results found.  Procedures Procedures (including critical care time)  Medications Ordered in UC Medications - No data to display  Initial Impression / Assessment and Plan / UC Course  I have reviewed the triage vital signs and the nursing notes.  Pertinent labs & imaging results that were available during my care of the patient were reviewed by me and considered in my medical decision making (see chart for details).     Patient has significant elevated blood pressure and showing signs of hypertensive emergency.  Advised patient to go the hospital for more extensive evaluation and to get blood pressure under control.  Patient was agreeable with plan.  Patient left via self transport to the hospital. Final Clinical Impressions(s) / UC Diagnoses   Final diagnoses:  Hypertensive emergency     Discharge Instructions      Please go to the emergency department as soon as you leave urgent care for further evaluation and management.    ED Prescriptions   None    PDMP not reviewed this encounter.   Teodora Medici, [place] 09/29/21 [phone]

## 2021-09-29 NOTE — Patient Instructions (Signed)
Terry Guerra, thank you for joining Piedad Climes, PA-C for today's virtual visit.  While this provider is not your primary care provider (PCP), if your PCP is located in our provider database this encounter information will be shared with them immediately following your visit.  Consent: (Patient) Terry Guerra provided verbal consent for this virtual visit at the beginning of the encounter.  Current Medications:  Current Outpatient Medications:    amLODipine (NORVASC) 10 MG tablet, TAKE 1 TABLET BY MOUTH EVERY DAY, Disp: 30 tablet, Rfl: 0   lisinopril (ZESTRIL) 10 MG tablet, Take 1 tablet (10 mg total) by mouth daily. Must Have Appointment for Additional REFILLS, Disp: 30 tablet, Rfl: 0   permethrin (ELIMITE) 5 % cream, Apply to entire body other than face - let sit for 14 hours then wash off, may repeat in 1 week if still having symptoms, Disp: 60 g, Rfl: 1   pravastatin (PRAVACHOL) 40 MG tablet, TAKE 1 TABLET BY MOUTH EVERY DAY (Patient taking differently: Take 40 mg by mouth daily. ), Disp: 90 tablet, Rfl: 1   triamcinolone cream (KENALOG) 0.1 %, Apply 1 application topically 2 (two) times daily., Disp: 80 g, Rfl: 0   Medications ordered in this encounter:  No orders of the defined types were placed in this encounter.    *If you need refills on other medications prior to your next appointment, please contact your pharmacy*  Follow-Up: Call back or seek an in-person evaluation if the symptoms worsen or if the condition fails to improve as anticipated.  Other Instructions Please keep a low salt diet (see below). Use the link below to be scheduled for an in-person evaluation ASAP. We need to verify your BP and you need a good heart and lung examination, and possible EKG. This way proper treatment can be started until you can get in with a new PCP.  If you note any chest pain, shortness of breath or dizziness, you need an ER evaluation ASAP. DO NOT DELAY CARE!   If you have  been instructed to have an in-person evaluation today at a local Urgent Care facility, please use the link below. It will take you to a list of all of our available [facility], including address, phone number and hours of operation. Please do not delay care.  [facility]   DASH Eating Plan DASH stands for Dietary Approaches to Stop Hypertension. The DASH eating plan is a healthy eating plan that has been shown to: Reduce high blood pressure (hypertension). Reduce your risk for type 2 diabetes, heart disease, and stroke. Help with weight loss. What are tips for following this plan? Reading food labels Check food labels for the amount of salt (sodium) per serving. Choose foods with less than 5 percent of the Daily Value of sodium. Generally, foods with less than 300 milligrams (mg) of sodium per serving fit into this eating plan. To find whole grains, look for the word "whole" as the first word in the ingredient list. Shopping Buy products labeled as "low-sodium" or "no salt added." Buy fresh foods. Avoid canned foods and pre-made or frozen meals. Cooking Avoid adding salt when cooking. Use salt-free seasonings or herbs instead of table salt or sea salt. Check with your health care provider or pharmacist before using salt substitutes. Do not fry foods. Cook foods using healthy methods such as baking, boiling, grilling, roasting, and broiling instead. Cook with heart-healthy oils, such as olive, canola, avocado, soybean, or sunflower  oil. Meal planning  Eat a balanced diet that includes: 4 or more servings of fruits and 4 or more servings of vegetables each day. Try to fill one-half of your plate with fruits and vegetables. 6-8 servings of whole grains each day. Less than 6 oz (170 g) of lean meat, poultry, or fish each day. A 3-oz (85-g) serving of meat is about the same size as a deck of cards. One egg equals 1 oz (28 g). 2-3 servings of low-fat dairy each day. One  serving is 1 cup (237 mL). 1 serving of nuts, seeds, or beans 5 times each week. 2-3 servings of heart-healthy fats. Healthy fats called omega-3 fatty acids are found in foods such as walnuts, flaxseeds, fortified milks, and eggs. These fats are also found in cold-water fish, such as sardines, salmon, and mackerel. Limit how much you eat of: Canned or prepackaged foods. Food that is high in trans fat, such as some fried foods. Food that is high in saturated fat, such as fatty meat. Desserts and other sweets, sugary drinks, and other foods with added sugar. Full-fat dairy products. Do not salt foods before eating. Do not eat more than 4 egg yolks a week. Try to eat at least 2 vegetarian meals a week. Eat more home-cooked food and less restaurant, buffet, and fast food. Lifestyle When eating at a restaurant, ask that your food be prepared with less salt or no salt, if possible. If you drink alcohol: Limit how much you use to: 0-1 drink a day for women who are not pregnant. 0-2 drinks a day for men. Be aware of how much alcohol is in your drink. In the [place] one drink equals one 12 oz bottle of beer (355 mL), one 5 oz glass of wine (148 mL), or one 1 oz glass of hard liquor (44 mL). General information Avoid eating more than 2,300 mg of salt a day. If you have hypertension, you may need to reduce your sodium intake to 1,500 mg a day. Work with your health care provider to maintain a healthy body weight or to lose weight. Ask what an ideal weight is for you. Get at least 30 minutes of exercise that causes your heart to beat faster (aerobic exercise) most days of the week. Activities may include walking, swimming, or biking. Work with your health care provider or dietitian to adjust your eating plan to your individual calorie needs. What foods should I eat? Fruits All fresh, dried, or frozen fruit. Canned fruit in natural juice (without added sugar). Vegetables Fresh or frozen vegetables  (raw, steamed, roasted, or grilled). Low-sodium or reduced-sodium tomato and vegetable juice. Low-sodium or reduced-sodium tomato sauce and tomato paste. Low-sodium or reduced-sodium canned vegetables. Grains Whole-grain or whole-wheat bread. Whole-grain or whole-wheat pasta. Brown rice. Orpah Cobb. Bulgur. Whole-grain and low-sodium cereals. Pita bread. Low-fat, low-sodium crackers. Whole-wheat flour tortillas. Meats and other proteins Skinless chicken or [place]. Ground chicken or [place]. Pork with fat trimmed off. Fish and seafood. Egg whites. Dried beans, peas, or lentils. Unsalted nuts, nut butters, and seeds. Unsalted canned beans. Lean cuts of beef with fat trimmed off. Low-sodium, lean precooked or cured meat, such as sausages or meat loaves. Dairy Low-fat (1%) or fat-free (skim) milk. Reduced-fat, low-fat, or fat-free cheeses. Nonfat, low-sodium ricotta or cottage cheese. Low-fat or nonfat yogurt. Low-fat, low-sodium cheese. Fats and oils Soft margarine without trans fats. Vegetable oil. Reduced-fat, low-fat, or light mayonnaise and salad dressings (reduced-sodium). Canola, safflower, olive, avocado, soybean, and sunflower oils.  Avocado. Seasonings and condiments Herbs. Spices. Seasoning mixes without salt. Other foods Unsalted popcorn and pretzels. Fat-free sweets. The items listed above may not be a complete list of foods and beverages you can eat. Contact a dietitian for more information. What foods should I avoid? Fruits Canned fruit in a light or heavy syrup. Fried fruit. Fruit in cream or butter sauce. Vegetables Creamed or fried vegetables. Vegetables in a cheese sauce. Regular canned vegetables (not low-sodium or reduced-sodium). Regular canned tomato sauce and paste (not low-sodium or reduced-sodium). Regular tomato and vegetable juice (not low-sodium or reduced-sodium). Rosita Fire. Olives. Grains Baked goods made with fat, such as croissants, muffins, or some breads. Dry pasta  or rice meal packs. Meats and other proteins Fatty cuts of meat. Ribs. Fried meat. Tomasa Blase. Bologna, salami, and other precooked or cured meats, such as sausages or meat loaves. Fat from the back of a pig (fatback). Bratwurst. Salted nuts and seeds. Canned beans with added salt. Canned or smoked fish. Whole eggs or egg yolks. Chicken or [place] with skin. Dairy Whole or 2% milk, cream, and half-and-half. Whole or full-fat cream cheese. Whole-fat or sweetened yogurt. Full-fat cheese. Nondairy creamers. Whipped toppings. Processed cheese and cheese spreads. Fats and oils Butter. Stick margarine. Lard. Shortening. Ghee. Bacon fat. Tropical oils, such as coconut, palm kernel, or palm oil. Seasonings and condiments Onion salt, garlic salt, seasoned salt, table salt, and sea salt. Worcestershire sauce. Tartar sauce. Barbecue sauce. Teriyaki sauce. Soy sauce, including reduced-sodium. Steak sauce. Canned and packaged gravies. Fish sauce. Oyster sauce. Cocktail sauce. Store-bought horseradish. Ketchup. Mustard. Meat flavorings and tenderizers. Bouillon cubes. Hot sauces. Pre-made or packaged marinades. Pre-made or packaged taco seasonings. Relishes. Regular salad dressings. Other foods Salted popcorn and pretzels. The items listed above may not be a complete list of foods and beverages you should avoid. Contact a dietitian for more information. Where to find more information National Heart, Lung, and Blood Institute: PopSteam.is American Heart Association: www.heart.org Academy of Nutrition and Dietetics: www.eatright.org National Kidney Foundation: www.kidney.org Summary The DASH eating plan is a healthy eating plan that has been shown to reduce high blood pressure (hypertension). It may also reduce your risk for type 2 diabetes, heart disease, and stroke. When on the DASH eating plan, aim to eat more fresh fruits and vegetables, whole grains, lean proteins, low-fat dairy, and heart-healthy  fats. With the DASH eating plan, you should limit salt (sodium) intake to 2,300 mg a day. If you have hypertension, you may need to reduce your sodium intake to 1,500 mg a day. Work with your health care provider or dietitian to adjust your eating plan to your individual calorie needs. This information is not intended to replace advice given to you by your health care provider. Make sure you discuss any questions you have with your health care provider. Document Revised: 07/14/2019 Document Reviewed: 07/14/2019 Elsevier Patient Education  2022 [employer].   If you or a family member do not have a primary care provider, use the link below to schedule a visit and establish care. When you choose a [facility] primary care physician or advanced practice provider, you gain a long-term partner in health. Find a Primary Care Provider  Learn more about [facility]'s in-office and virtual care options: [facility] - Get Care Now

## 2021-09-29 NOTE — Discharge Instructions (Signed)
Please go to the emergency department as soon as you leave urgent care for further evaluation and management. ?

## 2021-09-29 NOTE — Progress Notes (Signed)
Virtual Visit Consent   Terry Guerra, you are scheduled for a virtual visit with a [facility] provider today.     Just as with appointments in the office, your consent must be obtained to participate.  Your consent will be active for this visit and any virtual visit you may have with one of our providers in the next 365 days.     If you have a MyChart account, a copy of this consent can be sent to you electronically.  All virtual visits are billed to your insurance company just like a traditional visit in the office.    As this is a virtual visit, video technology does not allow for your provider to perform a traditional examination.  This may limit your provider's ability to fully assess your condition.  If your provider identifies any concerns that need to be evaluated in person or the need to arrange testing (such as labs, EKG, etc.), we will make arrangements to do so.     Although advances in technology are sophisticated, we cannot ensure that it will always work on either your end or our end.  If the connection with a video visit is poor, the visit may have to be switched to a telephone visit.  With either a video or telephone visit, we are not always able to ensure that we have a secure connection.     I need to obtain your verbal consent now.   Are you willing to proceed with your visit today?    Terry Guerra has provided verbal consent on 09/29/2021 for a virtual visit (video or telephone).   Leeanne Rio, [place]   Date: 09/29/2021 2:25 PM   Virtual Visit via Video Note   I, Leeanne Rio, connected with  Terry Guerra  ([id number], [birth year]) on 09/29/21 at  2:15 PM EST by a video-enabled telemedicine application and verified that I am speaking with the correct person using two identifiers.  Location: Patient: Virtual Visit Location Patient: Home Provider: Virtual Visit Location Provider: Home Office   I discussed the limitations of evaluation and management by  telemedicine and the availability of in person appointments. The patient expressed understanding and agreed to proceed.    History of Present Illness: Terry Guerra is a 61 y.o. who identifies as a male who was assigned male at birth, and is being seen today for high BP. Patient endorses history of HTN and OSA, previously on a combination of amlodipine 10 mg and Lisinopril 10 mg daily but has been out of medications for about a year now. Is currently without a PCP. Notes over the past couple of weeks having episodes of elevated BP. Seems to stress-mediated. Has been able to check BP cuff at home with BP averaging up to [id number] systolic on one previous check. BP at present on home machine is unable to read currently. Endorses some episodes of feeling flushed and having a headache. Denies chest pain or SOB. Denies lightheadedness or dizziness. Notes he keeps well-hydrated. Stays active as his works in [employer]. Notes increase in poor food choices with increase in weight. Denies personal or family history of known CAD, MI.   HPI: HPI  Problems:  Patient Active Problem List   Diagnosis Date Noted   Primary localized osteoarthritis of knee 07/26/2018   Primary osteoarthritis of right knee 07/04/2018   Primary osteoarthritis of knee 09/07/2017   Unilateral primary osteoarthritis, left knee 08/09/2017   OSA (obstructive sleep apnea) 08/09/2017  Dyslipidemia 12/09/2016   HTN (hypertension) 11/10/2011    Allergies: No Known Allergies Medications: No current outpatient medications on file.  Observations/Objective: Patient is well-developed, well-nourished in no acute distress.  Resting comfortably at home.  Head is normocephalic, atraumatic.  No labored breathing. Speech is clear and coherent with logical content.  Patient is alert and oriented at baseline.   Assessment and Plan: 1. Hypertension, unspecified type  Prior history of OSA and HTN. Off of medications > 1 year. Now with significant  elevations in BP with flushing and headache. No alarm signs or symptoms present. Unable to check BP now at home due to machine error. Will be sent for in-office evaluation to get a manual BP check, heart and lung exams, and further workup as indicated so he can be restarted on proper regimen until he can get in with a new PCP.   Follow Up Instructions: I discussed the assessment and treatment plan with the patient. The patient was provided an opportunity to ask questions and all were answered. The patient agreed with the plan and demonstrated an understanding of the instructions.  A copy of instructions were sent to the patient via MyChart unless otherwise noted below.   The patient was advised to call back or seek an in-person evaluation if the symptoms worsen or if the condition fails to improve as anticipated.  Time:  I spent 10 minutes with the patient via telehealth technology discussing the above problems/concerns.    Leeanne Rio, PA-C

## 2021-09-29 NOTE — ED Provider Notes (Signed)
[facility] Provider Note: Lowella Dell, MD, FACEP  CSN: [id number] MRN: [id number] ARRIVAL: 09/29/21 at 2007 ROOM: DB007/DB007   CHIEF COMPLAINT  Hypertension   HISTORY OF PRESENT ILLNESS  09/29/21 11:20 PM Terry Guerra is a 61 y.o. male who had a visit with a nurse practitioner at 7:40 PM today:  'Patient presents today for concerns for high blood pressure.  He reports that he took his blood pressure at home with a home cuff and it was 200 systolic so he had a video visit today.  Video visit healthcare provider advised him to follow-up for further evaluation in person.  He reports that he took blood pressure medication approximately 1 year ago.  He took amlodipine and lisinopril.  He stopped taking medication simply because he stopped going to PCP.  Reports that he has had "some head pressure" and some blurred vision over the past few days.  Denies chest pain, shortness of breath, nausea, vomiting, dizziness.'  He was diagnosed with a hypertensive emergency and told to come immediately to the emergency department.  His blood pressure on arrival here was 180/114, and most recently 190/113.  The patient denies any symptoms at the present time.  He denies headache, visual changes, chest pain, shortness of breath (except for usual dyspnea on exertion), nausea or vomiting.  The woman who accompanies him denies any mental status changes.  He states he is not taking his amlodipine 10 mg or lisinopril 10 mg in over a year because he has not had a PCP.   Past Medical History:  Diagnosis Date   Abrasion of shoulder, left    Arthritis    knees   Hypertension    Shoulder pain, left    had cortisone shots with reported relief    Sleep apnea    no CPAP    Past Surgical History:  Procedure Laterality Date   COLONOSCOPY     HERNIA REPAIR     NASAL SEPTUM SURGERY     PARTIAL KNEE ARTHROPLASTY Left 09/07/2017   Procedure: UNICOMPARTMENTAL KNEE;  Surgeon: Sheral Apley, MD;   Location: [facility] OR;  Service: Orthopedics;  Laterality: Left;   PARTIAL KNEE ARTHROPLASTY Right 07/26/2018   Procedure: UNICOMPARTMENTAL RIGHT KNEE;  Surgeon: Sheral Apley, MD;  Location: [facility] ORS;  Service: Orthopedics;  Laterality: Right;    Family History  Problem Relation Age of Onset   Cancer Mother    Diabetes Father    Allergies Brother     Social History   Tobacco Use   Smoking status: Former   Smokeless tobacco: Never   Tobacco comments:    Quit 1999  Vaping Use   Vaping Use: Never used  Substance Use Topics   Alcohol use: Yes    Alcohol/week: 12.0 standard drinks    Types: 12 Standard drinks or equivalent per week   Drug use: No    Prior to Admission medications   Not on File    Allergies Patient has no known allergies.   REVIEW OF SYSTEMS  Negative except as noted here or in the History of Present Illness.   PHYSICAL EXAMINATION  Initial Vital Signs Blood pressure (!) 190/113, pulse 74, temperature 97.9 F (36.6 C), temperature source Oral, resp. rate 18, height 5\' 8"  (1.727 m), weight 104.3 kg, SpO2 96 %.  Examination General: Well-developed, well-nourished male in no acute distress; appearance consistent with age of record HENT: normocephalic; atraumatic Eyes: pupils equal, round and reactive to light; extraocular muscles intact  Neck: supple Heart: regular rate and rhythm; no murmurs, rubs or gallops Lungs: clear to auscultation bilaterally Abdomen: soft; nondistended; nontender; no masses or hepatosplenomegaly; bowel sounds present Extremities: No deformity; full range of motion; pulses normal Neurologic: Awake, alert and oriented; motor function intact in all extremities and symmetric; no facial droop Skin: Warm and dry Psychiatric: Normal mood and affect   RESULTS  Summary of this visit's results, reviewed and interpreted by myself:   EKG Interpretation  Date/Time:  Monday September 29 2021 22:53:17 EST Ventricular Rate:  76 PR  Interval:  151 QRS Duration: 78 QT Interval:  420 QTC Calculation: 473 R Axis:   -30 Text Interpretation: Incomplete analysis due to missing data in precordial lead(s) Sinus rhythm Left axis deviation Nonspecific T abnormalities, lateral leads Baseline wander in lead(s) V1 Missing lead(s): V2 Confirmed by Shaquilla Kehres, Jonny Ruiz ([id number]) on 09/29/2021 11:01:39 PM       Laboratory Studies: Results for orders placed or performed during the hospital encounter of 09/29/21 (from the past 24 hour(s))  Basic metabolic panel     Status: None   Collection Time: 09/29/21 11:01 PM  Result Value Ref Range   Sodium 136 135 - 145 mmol/L   Potassium 3.7 3.5 - 5.1 mmol/L   Chloride 102 98 - 111 mmol/L   CO2 24 22 - 32 mmol/L   Glucose, Bld 92 70 - 99 mg/dL   BUN 17 8 - 23 mg/dL   Creatinine, Ser 5.70 0.61 - 1.24 mg/dL   Calcium 9.5 8.9 - 17.7 mg/dL   GFR, Estimated >93 >90 mL/min   Anion gap 10 5 - 15  CBC with Differential     Status: None   Collection Time: 09/29/21 11:01 PM  Result Value Ref Range   WBC 9.7 4.0 - 10.5 K/uL   RBC 5.04 4.22 - 5.81 MIL/uL   Hemoglobin 14.8 13.0 - 17.0 g/dL   HCT 30.0 92.3 - 30.0 %   MCV 88.1 80.0 - 100.0 fL   MCH 29.4 26.0 - 34.0 pg   MCHC 33.3 30.0 - 36.0 g/dL   RDW 76.2 26.3 - 33.5 %   Platelets 279 150 - 400 K/uL   nRBC 0.0 0.0 - 0.2 %   Neutrophils Relative % 75 %   Neutro Abs 7.3 1.7 - 7.7 K/uL   Lymphocytes Relative 15 %   Lymphs Abs 1.5 0.7 - 4.0 K/uL   Monocytes Relative 8 %   Monocytes Absolute 0.8 0.1 - 1.0 K/uL   Eosinophils Relative 1 %   Eosinophils Absolute 0.1 0.0 - 0.5 K/uL   Basophils Relative 1 %   Basophils Absolute 0.1 0.0 - 0.1 K/uL   Immature Granulocytes 0 %   Abs Immature Granulocytes 0.04 0.00 - 0.07 K/uL   Imaging Studies: No results found.  ED COURSE and MDM  Nursing notes, initial and subsequent vitals signs, including pulse oximetry, reviewed and interpreted by myself.  Vitals:   09/29/21 2026 09/29/21 2027 09/29/21 2200  09/29/21 2300  BP: (!) 180/114  (!) 181/112 (!) 190/113  Pulse: 91   74  Resp: 20   18  Temp: 97.9 F (36.6 C)     TempSrc: Oral     SpO2: 97%   96%  Weight:  104.3 kg    Height:  5\' 8"  (1.727 m)     Medications  amLODipine (NORVASC) tablet 10 mg (has no administration in time range)  lisinopril (ZESTRIL) tablet 10 mg (has no administration in time range)  The patient's blood pressure is higher than desired but he is showing no signs of a hypertensive emergency or urgency.  We will restart him on his amlodipine and lisinopril.  He is showing no evidence of endorgan damage based on history, exam and laboratory findings.  He was advised to reestablish with a primary care physician.   PROCEDURES  Procedures   ED DIAGNOSES     ICD-10-CM   1. Hypertension not at goal  I10     2. Medication refill  Z76.0          Dayanne Yiu, Jonny Ruiz, MD 09/29/21 2333

## 2021-09-29 NOTE — ED Triage Notes (Signed)
Pt via pov from home with htn. Pt states he has not had medication in over a year. Pt reports he does not have a pcp. Needs referral for pcp. Denies any cp, sob (unless running). NAD noted.

## 2021-09-29 NOTE — ED Notes (Signed)
EMT-P provided AVS using Teachback Method. Patient verbalizes understanding of Discharge Instructions. Opportunity for Questioning and Answers were provided by EMT-P. Patient Discharged from ED.  ? ?

## 2021-09-29 NOTE — ED Triage Notes (Signed)
Patient states that he has high blood pressure, had a virtual appointment today and advised to be seen at an urgent care.  Patient does not have a PCP and has been out of his BP meds in over a year.

## 2022-02-11 DIAGNOSIS — E7800 Pure hypercholesterolemia, unspecified: Secondary | ICD-10-CM | POA: Diagnosis not present

## 2022-02-11 DIAGNOSIS — I10 Essential (primary) hypertension: Secondary | ICD-10-CM | POA: Diagnosis not present

## 2022-03-11 DIAGNOSIS — I10 Essential (primary) hypertension: Secondary | ICD-10-CM | POA: Diagnosis not present

## 2022-03-11 DIAGNOSIS — Z125 Encounter for screening for malignant neoplasm of prostate: Secondary | ICD-10-CM | POA: Diagnosis not present

## 2022-03-11 DIAGNOSIS — E7800 Pure hypercholesterolemia, unspecified: Secondary | ICD-10-CM | POA: Diagnosis not present

## 2022-03-11 DIAGNOSIS — Z0000 Encounter for general adult medical examination without abnormal findings: Secondary | ICD-10-CM | POA: Diagnosis not present

## 2022-03-27 DIAGNOSIS — G4733 Obstructive sleep apnea (adult) (pediatric): Secondary | ICD-10-CM | POA: Diagnosis not present

## 2022-03-27 DIAGNOSIS — E669 Obesity, unspecified: Secondary | ICD-10-CM | POA: Diagnosis not present

## 2022-03-27 DIAGNOSIS — I10 Essential (primary) hypertension: Secondary | ICD-10-CM | POA: Diagnosis not present

## 2022-03-27 DIAGNOSIS — E7800 Pure hypercholesterolemia, unspecified: Secondary | ICD-10-CM | POA: Diagnosis not present

## 2022-03-27 DIAGNOSIS — R69 Illness, unspecified: Secondary | ICD-10-CM | POA: Diagnosis not present

## 2022-04-21 DIAGNOSIS — G4733 Obstructive sleep apnea (adult) (pediatric): Secondary | ICD-10-CM | POA: Diagnosis not present

## 2022-04-24 DIAGNOSIS — G4733 Obstructive sleep apnea (adult) (pediatric): Secondary | ICD-10-CM | POA: Diagnosis not present

## 2022-04-24 DIAGNOSIS — E7800 Pure hypercholesterolemia, unspecified: Secondary | ICD-10-CM | POA: Diagnosis not present

## 2022-04-24 DIAGNOSIS — I10 Essential (primary) hypertension: Secondary | ICD-10-CM | POA: Diagnosis not present

## 2023-01-12 DIAGNOSIS — Z6836 Body mass index (BMI) 36.0-36.9, adult: Secondary | ICD-10-CM | POA: Diagnosis not present

## 2023-01-12 DIAGNOSIS — Z791 Long term (current) use of non-steroidal anti-inflammatories (NSAID): Secondary | ICD-10-CM | POA: Diagnosis not present

## 2023-01-12 DIAGNOSIS — M1990 Unspecified osteoarthritis, unspecified site: Secondary | ICD-10-CM | POA: Diagnosis not present

## 2023-01-12 DIAGNOSIS — Z818 Family history of other mental and behavioral disorders: Secondary | ICD-10-CM | POA: Diagnosis not present

## 2023-01-12 DIAGNOSIS — I10 Essential (primary) hypertension: Secondary | ICD-10-CM | POA: Diagnosis not present

## 2023-01-12 DIAGNOSIS — Z8249 Family history of ischemic heart disease and other diseases of the circulatory system: Secondary | ICD-10-CM | POA: Diagnosis not present

## 2023-01-12 DIAGNOSIS — Z833 Family history of diabetes mellitus: Secondary | ICD-10-CM | POA: Diagnosis not present

## 2023-01-12 DIAGNOSIS — Z008 Encounter for other general examination: Secondary | ICD-10-CM | POA: Diagnosis not present

## 2023-01-12 DIAGNOSIS — G4733 Obstructive sleep apnea (adult) (pediatric): Secondary | ICD-10-CM | POA: Diagnosis not present

## 2023-01-12 DIAGNOSIS — Z803 Family history of malignant neoplasm of breast: Secondary | ICD-10-CM | POA: Diagnosis not present

## 2023-01-12 DIAGNOSIS — Z87891 Personal history of nicotine dependence: Secondary | ICD-10-CM | POA: Diagnosis not present

## 2023-01-12 DIAGNOSIS — E785 Hyperlipidemia, unspecified: Secondary | ICD-10-CM | POA: Diagnosis not present

## 2023-03-15 DIAGNOSIS — E669 Obesity, unspecified: Secondary | ICD-10-CM | POA: Diagnosis not present

## 2023-03-15 DIAGNOSIS — I10 Essential (primary) hypertension: Secondary | ICD-10-CM | POA: Diagnosis not present

## 2023-03-15 DIAGNOSIS — C4491 Basal cell carcinoma of skin, unspecified: Secondary | ICD-10-CM | POA: Diagnosis not present

## 2023-03-15 DIAGNOSIS — Z0000 Encounter for general adult medical examination without abnormal findings: Secondary | ICD-10-CM | POA: Diagnosis not present

## 2023-03-15 DIAGNOSIS — Z1211 Encounter for screening for malignant neoplasm of colon: Secondary | ICD-10-CM | POA: Diagnosis not present

## 2023-03-15 DIAGNOSIS — G4733 Obstructive sleep apnea (adult) (pediatric): Secondary | ICD-10-CM | POA: Diagnosis not present

## 2023-03-15 DIAGNOSIS — E7800 Pure hypercholesterolemia, unspecified: Secondary | ICD-10-CM | POA: Diagnosis not present

## 2023-03-15 DIAGNOSIS — Z125 Encounter for screening for malignant neoplasm of prostate: Secondary | ICD-10-CM | POA: Diagnosis not present

## 2023-03-23 DIAGNOSIS — M19012 Primary osteoarthritis, left shoulder: Secondary | ICD-10-CM | POA: Diagnosis not present

## 2023-04-29 ENCOUNTER — Encounter: Payer: Self-pay | Admitting: Dermatology

## 2023-04-29 ENCOUNTER — Ambulatory Visit (INDEPENDENT_AMBULATORY_CARE_PROVIDER_SITE_OTHER): Payer: 59 | Admitting: Dermatology

## 2023-04-29 VITALS — BP 137/86 | HR 86

## 2023-04-29 DIAGNOSIS — L720 Epidermal cyst: Secondary | ICD-10-CM | POA: Diagnosis not present

## 2023-04-29 DIAGNOSIS — L821 Other seborrheic keratosis: Secondary | ICD-10-CM | POA: Diagnosis not present

## 2023-04-29 DIAGNOSIS — D1801 Hemangioma of skin and subcutaneous tissue: Secondary | ICD-10-CM

## 2023-04-29 DIAGNOSIS — D229 Melanocytic nevi, unspecified: Secondary | ICD-10-CM | POA: Diagnosis not present

## 2023-04-29 DIAGNOSIS — D171 Benign lipomatous neoplasm of skin and subcutaneous tissue of trunk: Secondary | ICD-10-CM | POA: Diagnosis not present

## 2023-04-29 DIAGNOSIS — L578 Other skin changes due to chronic exposure to nonionizing radiation: Secondary | ICD-10-CM

## 2023-04-29 DIAGNOSIS — L723 Sebaceous cyst: Secondary | ICD-10-CM

## 2023-04-29 NOTE — Patient Instructions (Signed)

## 2023-04-29 NOTE — Progress Notes (Signed)
   New Patient Visit   Subjective  Terry Guerra is a 62 y.o. male who presents for the following: growth on back  Patient states he has growth located on the back that he would like to have examined. Patient reports the areas have been there for around 3 months. He reports the areas were bothersome. He states that the area did seem to blister up but now it has gone down. Patient reports he has not previously been treated for these areas. Patient does not have a  Hx of skin cancer. Patient may have  family history of skin cancer(s).     The following portions of the chart were reviewed this encounter and updated as appropriate: medications, allergies, medical history  Review of Systems:  No other skin or systemic complaints except as noted in HPI or Assessment and Plan.  Objective  Well appearing patient in no apparent distress; mood and affect are within normal limits.   A focused examination was performed of the following areas: Upper body  Relevant exam findings are noted in the Assessment and Plan.    Assessment & Plan   EPIDERMAL INCLUSION CYST- INFLAMED Exam: Subcutaneous nodule at mid back that is resolving  Benign-appearing. Exam most consistent with an epidermal inclusion cyst. Discussed that a cyst is a benign growth that can grow over time and sometimes get irritated or inflamed. Recommend observation if it is not bothersome. Discussed option of surgical excision to remove it if it is growing, symptomatic, or other changes noted. Please call for new or changing lesions so they can be evaluated to have removed.   Lipoma  Exam: Subcutaneous rubbery nodule(s) Location: left back  Benign-appearing. Exam most consistent with a lipoma. Discussed that a lipoma is a benign fatty growth that can grow over time and sometimes get irritated. Recommend observation if it is not bothersome or changing. Discussed option of ILK injections or surgical excision to remove it if it is growing,  symptomatic, or other changes noted. Please call for new or changing lesions so they can be evaluated.  Discussed possible surgical removal in future if the pt wants.  SEBORRHEIC KERATOSIS - Stuck-on, waxy, tan-brown papules and/or plaques  - Benign-appearing - Discussed benign etiology and prognosis. - Observe - Call for any changes  MELANOCYTIC NEVI Exam: Tan-brown and/or pink-flesh-colored symmetric macules and papules  Treatment Plan: Benign appearing on exam today. Recommend observation. Call clinic for new or changing moles. Recommend daily use of broad spectrum spf 30+ sunscreen to sun-exposed areas.     CHERRY ANGIOMAS Exam: red papule(s) Discussed benign nature. Recommend observation. Call for changes.   ACTINIC DAMAGE - chronic, secondary to cumulative UV radiation exposure/sun exposure over time - diffuse scaly erythematous macules with underlying dyspigmentation - Recommend daily broad spectrum sunscreen SPF 30+ to sun-exposed areas, reapply every 2 hours as needed.  - Recommend staying in the shade or wearing long sleeves, sun glasses (UVA+UVB protection) and wide brim hats (4-inch brim around the entire circumference of the hat). - Call for new or changing lesions.    Return if symptoms worsen or fail to improve.  I, Tillie Fantasia, CMA, am acting as scribe for Gwenith Daily, MD.   Documentation: I have reviewed the above documentation for accuracy and completeness, and I agree with the above.  Gwenith Daily, MD

## 2023-06-15 DIAGNOSIS — Z1211 Encounter for screening for malignant neoplasm of colon: Secondary | ICD-10-CM | POA: Diagnosis not present

## 2023-06-15 DIAGNOSIS — K621 Rectal polyp: Secondary | ICD-10-CM | POA: Diagnosis not present

## 2023-06-15 DIAGNOSIS — D122 Benign neoplasm of ascending colon: Secondary | ICD-10-CM | POA: Diagnosis not present

## 2023-06-15 DIAGNOSIS — D120 Benign neoplasm of cecum: Secondary | ICD-10-CM | POA: Diagnosis not present

## 2023-06-15 DIAGNOSIS — K5730 Diverticulosis of large intestine without perforation or abscess without bleeding: Secondary | ICD-10-CM | POA: Diagnosis not present

## 2023-10-21 DIAGNOSIS — Z791 Long term (current) use of non-steroidal anti-inflammatories (NSAID): Secondary | ICD-10-CM | POA: Diagnosis not present

## 2023-10-21 DIAGNOSIS — Z833 Family history of diabetes mellitus: Secondary | ICD-10-CM | POA: Diagnosis not present

## 2023-10-21 DIAGNOSIS — Z87891 Personal history of nicotine dependence: Secondary | ICD-10-CM | POA: Diagnosis not present

## 2023-10-21 DIAGNOSIS — Z8249 Family history of ischemic heart disease and other diseases of the circulatory system: Secondary | ICD-10-CM | POA: Diagnosis not present

## 2023-10-21 DIAGNOSIS — I10 Essential (primary) hypertension: Secondary | ICD-10-CM | POA: Diagnosis not present

## 2023-10-21 DIAGNOSIS — E785 Hyperlipidemia, unspecified: Secondary | ICD-10-CM | POA: Diagnosis not present

## 2023-10-21 DIAGNOSIS — M1990 Unspecified osteoarthritis, unspecified site: Secondary | ICD-10-CM | POA: Diagnosis not present

## 2023-10-21 DIAGNOSIS — Z6838 Body mass index (BMI) 38.0-38.9, adult: Secondary | ICD-10-CM | POA: Diagnosis not present

## 2023-10-21 DIAGNOSIS — F1421 Cocaine dependence, in remission: Secondary | ICD-10-CM | POA: Diagnosis not present

## 2023-10-21 DIAGNOSIS — F1010 Alcohol abuse, uncomplicated: Secondary | ICD-10-CM | POA: Diagnosis not present

## 2023-10-21 DIAGNOSIS — G4733 Obstructive sleep apnea (adult) (pediatric): Secondary | ICD-10-CM | POA: Diagnosis not present

## 2024-03-16 DIAGNOSIS — Z0000 Encounter for general adult medical examination without abnormal findings: Secondary | ICD-10-CM | POA: Diagnosis not present

## 2024-03-16 DIAGNOSIS — Z125 Encounter for screening for malignant neoplasm of prostate: Secondary | ICD-10-CM | POA: Diagnosis not present

## 2024-09-12 ENCOUNTER — Other Ambulatory Visit: Payer: Self-pay | Admitting: Orthopaedic Surgery

## 2024-09-12 DIAGNOSIS — M19012 Primary osteoarthritis, left shoulder: Secondary | ICD-10-CM

## 2024-09-18 ENCOUNTER — Inpatient Hospital Stay: Admission: RE | Admit: 2024-09-18 | Source: Ambulatory Visit

## 2024-09-21 ENCOUNTER — Ambulatory Visit
Admission: RE | Admit: 2024-09-21 | Discharge: 2024-09-21 | Disposition: A | Discharge status: Home / Self Care | Source: Ambulatory Visit | Attending: Orthopaedic Surgery | Admitting: Orthopaedic Surgery

## 2024-09-21 DIAGNOSIS — M19012 Primary osteoarthritis, left shoulder: Secondary | ICD-10-CM

## 2024-10-12 ENCOUNTER — Encounter (HOSPITAL_BASED_OUTPATIENT_CLINIC_OR_DEPARTMENT_OTHER): Payer: Self-pay

## 2024-10-12 ENCOUNTER — Ambulatory Visit (HOSPITAL_BASED_OUTPATIENT_CLINIC_OR_DEPARTMENT_OTHER): Admit: 2024-10-12 | Admitting: Orthopaedic Surgery
